# Patient Record
Sex: Male | Born: 1985 | Race: White | Hispanic: No | Marital: Single | Smoking: Never smoker
Health system: Southern US, Community
[De-identification: ages and names within clinical notes are randomized; demographics above are authoritative.]

---

## 2014-05-30 ENCOUNTER — Observation Stay (HOSPITAL_COMMUNITY)
Admission: EM | Admit: 2014-05-30 | Discharge: 2014-05-31 | Disposition: A | Discharge status: Home / Self Care | Payer: BC Managed Care – PPO | Attending: Otolaryngology | Admitting: Otolaryngology

## 2014-05-30 ENCOUNTER — Observation Stay (HOSPITAL_COMMUNITY): Payer: BC Managed Care – PPO | Admitting: Anesthesiology

## 2014-05-30 ENCOUNTER — Encounter (HOSPITAL_COMMUNITY)
Admission: EM | Disposition: A | Discharge status: Home / Self Care | Payer: Self-pay | Source: Home / Self Care | Attending: Emergency Medicine

## 2014-05-30 ENCOUNTER — Encounter (HOSPITAL_COMMUNITY): Payer: Self-pay | Admitting: Emergency Medicine

## 2014-05-30 ENCOUNTER — Encounter (HOSPITAL_COMMUNITY): Payer: BC Managed Care – PPO | Admitting: Anesthesiology

## 2014-05-30 ENCOUNTER — Emergency Department (HOSPITAL_COMMUNITY): Payer: BC Managed Care – PPO

## 2014-05-30 DIAGNOSIS — Y998 Other external cause status: Secondary | ICD-10-CM | POA: Diagnosis not present

## 2014-05-30 DIAGNOSIS — Y9389 Activity, other specified: Secondary | ICD-10-CM | POA: Insufficient documentation

## 2014-05-30 DIAGNOSIS — Y9289 Other specified places as the place of occurrence of the external cause: Secondary | ICD-10-CM | POA: Insufficient documentation

## 2014-05-30 DIAGNOSIS — S02609A Fracture of mandible, unspecified, initial encounter for closed fracture: Secondary | ICD-10-CM | POA: Diagnosis present

## 2014-05-30 DIAGNOSIS — S0262XA Fracture of subcondylar process of mandible, initial encounter for closed fracture: Principal | ICD-10-CM | POA: Insufficient documentation

## 2014-05-30 DIAGNOSIS — K088 Other specified disorders of teeth and supporting structures: Secondary | ICD-10-CM | POA: Insufficient documentation

## 2014-05-30 HISTORY — PX: ORIF MANDIBULAR FRACTURE: SHX2127

## 2014-05-30 SURGERY — OPEN REDUCTION INTERNAL FIXATION (ORIF) MANDIBULAR FRACTURE
Anesthesia: General | Site: Mouth

## 2014-05-30 MED ORDER — FENTANYL CITRATE 0.05 MG/ML IJ SOLN
INTRAMUSCULAR | Status: DC | PRN
  Administered 2014-05-30: 100 ug via INTRAVENOUS
  Administered 2014-05-30: 50 ug via INTRAVENOUS
  Administered 2014-05-30: 100 ug via INTRAVENOUS

## 2014-05-30 MED ORDER — ONDANSETRON HCL 4 MG/2ML IJ SOLN
INTRAMUSCULAR | Status: DC | PRN
  Administered 2014-05-30: 4 mg via INTRAVENOUS

## 2014-05-30 MED ORDER — PROMETHAZINE HCL 25 MG RE SUPP: 25.0000 mg | Freq: Four times a day (QID) | RECTAL | Status: DC | PRN

## 2014-05-30 MED ORDER — SUCCINYLCHOLINE CHLORIDE 20 MG/ML IJ SOLN
INTRAMUSCULAR | Status: DC | PRN
  Administered 2014-05-30: 120 mg via INTRAVENOUS

## 2014-05-30 MED ORDER — HYDROCODONE-ACETAMINOPHEN 7.5-325 MG/15ML PO SOLN: 15.0000 mL | Freq: Four times a day (QID) | ORAL | Status: DC | PRN

## 2014-05-30 MED ORDER — HYDROCODONE-ACETAMINOPHEN 7.5-325 MG/15ML PO SOLN
10.0000 mL | ORAL | Status: DC | PRN
  Administered 2014-05-30 – 2014-05-31 (×5): 15 mL via ORAL
  Filled 2014-05-30 (×5): qty 15

## 2014-05-30 MED ORDER — ROCURONIUM BROMIDE 50 MG/5ML IV SOLN
INTRAVENOUS | Status: AC
  Filled 2014-05-30: qty 1

## 2014-05-30 MED ORDER — CLINDAMYCIN PHOSPHATE 600 MG/50ML IV SOLN
600.0000 mg | Freq: Three times a day (TID) | INTRAVENOUS | Status: DC
  Administered 2014-05-30: 900 mg via INTRAVENOUS
  Administered 2014-05-30 – 2014-05-31 (×2): 600 mg via INTRAVENOUS
  Filled 2014-05-30 (×5): qty 50

## 2014-05-30 MED ORDER — SUCCINYLCHOLINE CHLORIDE 20 MG/ML IJ SOLN
INTRAMUSCULAR | Status: AC
  Filled 2014-05-30: qty 1

## 2014-05-30 MED ORDER — LIDOCAINE HCL (CARDIAC) 20 MG/ML IV SOLN
INTRAVENOUS | Status: AC
  Filled 2014-05-30: qty 5

## 2014-05-30 MED ORDER — FENTANYL CITRATE 0.05 MG/ML IJ SOLN
INTRAMUSCULAR | Status: AC
  Filled 2014-05-30: qty 5

## 2014-05-30 MED ORDER — OXYMETAZOLINE HCL 0.05 % NA SOLN
NASAL | Status: DC | PRN
  Administered 2014-05-30: 1 via NASAL

## 2014-05-30 MED ORDER — HYDROCODONE-ACETAMINOPHEN 5-325 MG PO TABS
2.0000 | ORAL_TABLET | Freq: Once | ORAL | Status: AC
  Administered 2014-05-30: 2 via ORAL
  Filled 2014-05-30: qty 2

## 2014-05-30 MED ORDER — PROPOFOL 10 MG/ML IV BOLUS
INTRAVENOUS | Status: AC
  Filled 2014-05-30: qty 20

## 2014-05-30 MED ORDER — CLINDAMYCIN PALMITATE HCL 75 MG/5ML PO SOLR
300.0000 mg | Freq: Three times a day (TID) | ORAL | Status: DC
  Administered 2014-05-30 – 2014-05-31 (×2): 300 mg via ORAL
  Filled 2014-05-30 (×6): qty 20

## 2014-05-30 MED ORDER — PROPOFOL 10 MG/ML IV BOLUS
INTRAVENOUS | Status: DC | PRN
  Administered 2014-05-30: 200 mg via INTRAVENOUS

## 2014-05-30 MED ORDER — OXYCODONE HCL 5 MG/5ML PO SOLN: 5.0000 mg | Freq: Once | ORAL | Status: DC | PRN

## 2014-05-30 MED ORDER — 0.9 % SODIUM CHLORIDE (POUR BTL) OPTIME
TOPICAL | Status: DC | PRN
  Administered 2014-05-30: 1000 mL

## 2014-05-30 MED ORDER — DEXTROSE-NACL 5-0.9 % IV SOLN
INTRAVENOUS | Status: DC
  Administered 2014-05-30: 18:00:00 via INTRAVENOUS

## 2014-05-30 MED ORDER — PROMETHAZINE HCL 25 MG PO TABS: 25.0000 mg | ORAL_TABLET | Freq: Four times a day (QID) | ORAL | Status: DC | PRN

## 2014-05-30 MED ORDER — SODIUM CHLORIDE 0.9 % IV SOLN
INTRAVENOUS | Status: AC
  Administered 2014-05-30: 11:00:00 via INTRAVENOUS

## 2014-05-30 MED ORDER — OXYMETAZOLINE HCL 0.05 % NA SOLN
NASAL | Status: AC
  Filled 2014-05-30: qty 15

## 2014-05-30 MED ORDER — HYDROMORPHONE HCL 1 MG/ML IJ SOLN: 0.2500 mg | INTRAMUSCULAR | Status: DC | PRN

## 2014-05-30 MED ORDER — BACITRACIN ZINC 500 UNIT/GM EX OINT
TOPICAL_OINTMENT | CUTANEOUS | Status: AC
  Filled 2014-05-30: qty 15

## 2014-05-30 MED ORDER — MIDAZOLAM HCL 2 MG/2ML IJ SOLN
INTRAMUSCULAR | Status: AC
  Filled 2014-05-30: qty 2

## 2014-05-30 MED ORDER — IBUPROFEN 100 MG/5ML PO SUSP
400.0000 mg | Freq: Four times a day (QID) | ORAL | Status: DC | PRN
  Filled 2014-05-30: qty 20

## 2014-05-30 MED ORDER — LIDOCAINE-EPINEPHRINE 1 %-1:100000 IJ SOLN
INTRAMUSCULAR | Status: AC
  Filled 2014-05-30: qty 1

## 2014-05-30 MED ORDER — MIDAZOLAM HCL 5 MG/5ML IJ SOLN
INTRAMUSCULAR | Status: DC | PRN
  Administered 2014-05-30: 2 mg via INTRAVENOUS

## 2014-05-30 MED ORDER — OXYCODONE HCL 5 MG PO TABS: 5.0000 mg | ORAL_TABLET | Freq: Once | ORAL | Status: DC | PRN

## 2014-05-30 MED ORDER — LACTATED RINGERS IV SOLN
INTRAVENOUS | Status: DC
  Administered 2014-05-30 (×2): via INTRAVENOUS

## 2014-05-30 MED ORDER — PROMETHAZINE HCL 25 MG/ML IJ SOLN: 6.2500 mg | INTRAMUSCULAR | Status: DC | PRN

## 2014-05-30 MED ORDER — ONDANSETRON HCL 4 MG/2ML IJ SOLN
INTRAMUSCULAR | Status: AC
  Filled 2014-05-30: qty 2

## 2014-05-30 MED ORDER — CLINDAMYCIN HCL 300 MG PO CAPS: 300.0000 mg | ORAL_CAPSULE | Freq: Three times a day (TID) | ORAL | Status: DC

## 2014-05-30 MED ORDER — LIDOCAINE HCL (CARDIAC) 20 MG/ML IV SOLN
INTRAVENOUS | Status: DC | PRN
  Administered 2014-05-30: 80 mg via INTRAVENOUS

## 2014-05-30 SURGICAL SUPPLY — 29 items
CANISTER SUCTION 2500CC (MISCELLANEOUS) ×2 IMPLANT
CLEANER TIP ELECTROSURG 2X2 (MISCELLANEOUS) ×2 IMPLANT
COVER SURGICAL LIGHT HANDLE (MISCELLANEOUS) ×2 IMPLANT
ELECT COATED BLADE 2.86 ST (ELECTRODE) IMPLANT
ELECT NEEDLE TIP 2.8 STRL (NEEDLE) ×2 IMPLANT
ELECT REM PT RETURN 9FT ADLT (ELECTROSURGICAL) ×2
ELECTRODE REM PT RTRN 9FT ADLT (ELECTROSURGICAL) ×1 IMPLANT
GLOVE ECLIPSE 7.5 STRL STRAW (GLOVE) ×2 IMPLANT
GOWN STRL REUS W/ TWL LRG LVL3 (GOWN DISPOSABLE) ×2 IMPLANT
GOWN STRL REUS W/TWL LRG LVL3 (GOWN DISPOSABLE) ×2
KIT BASIN OR (CUSTOM PROCEDURE TRAY) ×2 IMPLANT
KIT ROOM TURNOVER OR (KITS) ×2 IMPLANT
NEEDLE 27GAX1X1/2 (NEEDLE) ×2 IMPLANT
NS IRRIG 1000ML POUR BTL (IV SOLUTION) ×2 IMPLANT
PAD ARMBOARD 7.5X6 YLW CONV (MISCELLANEOUS) ×4 IMPLANT
PENCIL FOOT CONTROL (ELECTRODE) ×2 IMPLANT
PLATE HYBRID MMF SM (Plate) ×4 IMPLANT
SCISSORS WIRE DISP (INSTRUMENTS) ×2 IMPLANT
SCREW LOCK SELFDRIL 2.0X8M MMF (Screw) ×22 IMPLANT
SUT CHROMIC 3 0 PS 2 (SUTURE) ×2 IMPLANT
SUT STEEL 0 (SUTURE)
SUT STEEL 0 18XMFL TIE 17 (SUTURE) IMPLANT
SUT STEEL 2 (SUTURE) ×2 IMPLANT
SUT STEEL 4 (SUTURE) IMPLANT
SUT VICRYL 4-0 PS2 18IN ABS (SUTURE) ×2 IMPLANT
TOWEL OR 17X24 6PK STRL BLUE (TOWEL DISPOSABLE) ×2 IMPLANT
TOWEL OR 17X26 10 PK STRL BLUE (TOWEL DISPOSABLE) ×2 IMPLANT
TRAY ENT MC OR (CUSTOM PROCEDURE TRAY) ×2 IMPLANT
WATER STERILE IRR 1000ML POUR (IV SOLUTION) ×2 IMPLANT

## 2014-05-30 NOTE — ED Notes (Signed)
Patient returned from CT

## 2014-05-30 NOTE — ED Notes (Signed)
Pt resting; visitor at bedside; no needs at this time

## 2014-05-30 NOTE — Transfer of Care (Signed)
Immediate Anesthesia Transfer of Care Note  Patient: Craig Stewart  Procedure(s) Performed: Procedure(s): OPEN REDUCTION INTERNAL FIXATION (ORIF) MANDIBULAR FRACTURE (N/A)  Patient Location: PACU  Anesthesia Type:General  Level of Consciousness: awake and responds to stimulation  Airway & Oxygen Therapy: Patient Spontanous Breathing and Patient connected to nasal cannula oxygen  Post-op Assessment: Report given to PACU RN and Post -op Vital signs reviewed and stable  Post vital signs: Reviewed and stable  Complications: No apparent anesthesia complications

## 2014-05-30 NOTE — Anesthesia Postprocedure Evaluation (Signed)
  Anesthesia Post-op Note  Patient: Craig Stewart, Craig Stewart  Procedure(s) Performed: Procedure(s): OPEN REDUCTION INTERNAL FIXATION (ORIF) MANDIBULAR FRACTURE (N/A)  Patient Location: PACU  Anesthesia Type:General  Level of Consciousness: awake and alert   Airway and Oxygen Therapy: Patient Spontanous Breathing  Post-op Pain: mild  Post-op Assessment: Post-op Vital signs reviewed  Post-op Vital Signs: stable  Last Vitals:  Filed Vitals:   05/30/14 1440  BP:   Pulse: 88  Temp: 36.7 C  Resp:     Complications: No apparent anesthesia complications

## 2014-05-30 NOTE — ED Notes (Addendum)
Consent form filled out, Patient still waiting to talk with Dr. Pollyann Kennedyosen who will speak with him in pre-op.  Consent form to be sent up with patient.
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
## 2014-05-30 NOTE — Op Note (Signed)
OPERATIVE REPORT  DATE OF SURGERY: 05/30/2014  PATIENT:  Newt LukesPiotr Wrench,  28 y.o. male  PRE-OPERATIVE DIAGNOSIS:  MANDIBLE FX  POST-OPERATIVE DIAGNOSIS:  MANDIBLE FX  PROCEDURE:  Procedure(s): Maxillomandibular fixation treatment of MANDIBULAR FRACTURE  SURGEON:  Susy FrizzleJefry H Akaisha Truman, MD  ASSISTANTS: None   ANESTHESIA:   General   EBL:  20 ml  DRAINS: None   LOCAL MEDICATIONS USED:  None  SPECIMEN:  none  COUNTS:  Correct  PROCEDURE DETAILS: The patient was taken to the operating room and placed on the operating table in the supine position. Following induction of general endotracheal anesthesia using a nasal endotracheal tube, the patient was prepped and draped in a standard fashion. The teeth were brush with Betadine solution. A cheek retractor was used throughout the case. The anterior mandibular fracture was easily identified by mobilizing the 2 sides. The patient was brought back into normal occlusion. Hypercurved were placed upper and lower using 8 mm screws to secure them in place, taking care to avoid tooth root. 24-gauge wire loops were then placed 2 on each side to keep the occlusion in what appeared to be normal position. There was good stability. The oral cavity was rinsed with saline and suctioned. The patient was awakened, extubated and transferred to PACU in stable condition.    PATIENT DISPOSITION:  To PACU, stable

## 2014-05-30 NOTE — ED Notes (Signed)
Patient transported to CT 

## 2014-05-30 NOTE — Anesthesia Preprocedure Evaluation (Signed)
Anesthesia Evaluation  Patient identified by MRN, date of birth, ID band  History of Anesthesia Complications Negative for: history of anesthetic complications  Airway Mallampati: IV    Mouth opening: Limited Mouth Opening  Dental   Pulmonary neg pulmonary ROS,  breath sounds clear to auscultation        Cardiovascular negative cardio ROS  Rhythm:Regular Rate:Normal     Neuro/Psych    GI/Hepatic   Endo/Other    Renal/GU      Musculoskeletal   Abdominal   Peds negative pediatric ROS (+)  Hematology   Anesthesia Other Findings   Reproductive/Obstetrics                           Anesthesia Physical Anesthesia Plan  ASA: II  Anesthesia Plan: General   Post-op Pain Management:    Induction: Intravenous  Airway Management Planned: Nasal ETT  Additional Equipment:   Intra-op Plan:   Post-operative Plan:   Informed Consent: I have reviewed the patients History and Physical, chart, labs and discussed the procedure including the risks, benefits and alternatives for the proposed anesthesia with the patient or authorized representative who has indicated his/her understanding and acceptance.   Dental advisory given  Plan Discussed with: CRNA and Surgeon  Anesthesia Plan Comments:         Anesthesia Quick Evaluation
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
## 2014-05-30 NOTE — Discharge Instructions (Signed)
There are 4 circles of wire holding the jaws together, in the event of an emergency such as vomiting, cut call four and remove.   Fractured-Jaw Meal Plan The purpose of the fractured-jaw meal plan is to provide foods that can be easily blended and easily swallowed. This plan is typically used after jaw or mouth surgery, wired jaw surgery, or dental surgery. Foods in this plan need to be blended so that they can be sipped from a straw or given through a syringe. You should try to have at least three meals and three snacks daily. It is important to make sure you get enough calories and protein to prevent weight loss and help your body heal, especially after surgery. You may wish to include a liquid multivitamin in your plan to ensure that you get all the vitamins and minerals you need. Ask your health care provider for a recommendation.  HOW DO I PREPARE MY MEALS? All foods in this plan must be blended. Avoid nuts, seeds, skins, peels, bones, or any foods that cannot be blended to the right consistency. Make sure to eat a variety of foods from each food group every day. The following tips can help you as you blend your food:  Remove skins, seeds, and peels from food.  Cook meats and vegetables thoroughly.  Cut foods into small pieces and mix with a small amount of liquid in a food processor or blender. Continue to add liquid until the food becomes thin enough to sip through a straw.  Adding liquids such as juice, milk, cream, broth, gravy, or vegetable juice can help add flavor to foods.  Heat foods after they have been blended to reduce the amount of foam created from blending.  Heat or cool your foods to lukewarm temperatures if your teeth and mouth are sensitive to extreme temperatures. WHAT FOODS CAN I EAT? Make sure to eat a variety of foods from each food group.  Grains  Hot cereals, such as oatmeal, grits, ground wheat cereals, and polenta.  Rice and  pasta.  Couscous. Vegetables  All cooked or canned vegetables, without seeds and skins.  Vegetable juices.  Cooked potatoes, without skins. Fruit  Any cooked or canned fruits, without seeds and skins.  Fresh, peeled soft fruits, such as bananas and peaches, that can be blended until smooth.  All fruit juices, without seeds and skins. Meat and Other Protein Sources  Soft-boiled eggs, scrambled eggs, powdered eggs, pasteurized egg mixtures, and custard.  Ground meats, such as hamburger, [place]key, sausage, and meatloaf.  Tender, well-cooked meat, poultry, and fish prepared without bones or skin.  Soft soy foods (such as tofu).  Smooth nut butters. Dairy  All are allowed. Beverages  Coffee (regular or decaffeinated), tea, and mineral water. Condiments  All seasonings and condiments that blend well. WHEN MAY I NEED TO SUPPLEMENT MY MEALS? If you begin to lose weight on this plan, you may need to increase the amount of food you are eating or the number of calories in your food or both. You can increase the number of calories by adding any of the following foods:  Protein powder or powdered milk.  Extra fats, such as margarine (without trans fat), sour cream, cream cheese, cream, and nut butters, such as peanut butter or almond butter.  Sweets, such as honey, ice cream, blackstrap molasses, or sugar. Document Released: 01/25/2010 Document Revised: 12/22/2013 Document Reviewed: 07/04/2013 [facility] Patient Information 2015 [place], [place]. This information is not intended to replace advice given to  you by your health care provider. Make sure you discuss any questions you have with your health care provider.  Mandibular Fracture  A mandibular fracture is a break in the jawbone. Surgery is often needed to put the jaw back in the right position. Wires may be placed around the teeth to hold the jaw in place while it heals. HOME CARE  Put ice on the injured area.  Put ice in a  plastic bag.  Place a towel between your skin and the bag.  Leave the ice on for 15-20 minutes, 03-04 times a day. Do this for the first 2 days.  Only take medicines as told by your doctor.  Eat soft or liquid foods as told by your doctor. Eat plenty of protein.  If your jaws are wired, follow your doctor's directions for wired jaw care.  Sleep on your back to avoid putting pressure on your jaw.  Avoid exercising so hard that you become short of breath. GET HELP RIGHT AWAY IF:  You have a fever.  You have trouble breathing.  You feel like your airway is tight.  You cannot swallow your spit (saliva).  You make a high-pitched whistling sound when you breathe (wheezing).  You have a bad headache or lose feeling in your face (numbness).  You have bad jaw pain that does not get better with medicine.  Your jaw wires become loose.  You feel sick to your stomach (nauseous) or worried (anxious).  Your puffiness (swelling) or redness gets worse. MAKE SURE YOU:  Understand these instructions.  Will watch your condition.  Will get help right away if you are not doing well or get worse. Document Released: 10/30/2011 Document Reviewed: 10/30/2011 [facility] Patient Information 2015 [place], [place]. This information is not intended to replace advice given to you by your health care provider. Make sure you discuss any questions you have with your health care provider.
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
## 2014-05-31 NOTE — Progress Notes (Signed)
UR completed 

## 2014-05-31 NOTE — Progress Notes (Signed)
IV removed per order. Discharge instructions and prescription given with teachback. Wire cutters sent home with patient. Discharged via wheelchair to friend's care with NT present. Trina Aoarla Rakin Lemelle, RN

## 2014-05-31 NOTE — ED Provider Notes (Signed)
Medical screening examination/treatment/procedure(s) were conducted as a shared visit with non-physician practitioner(s) and myself.  I personally evaluated the patient during the encounter.   EKG Interpretation None     Patient with jaw fracture. Seen by ENT and taken to operating room.  Juliet RudeNathan R. Rubin PayorPickering, MD 05/31/14 [id number]

## 2014-05-31 NOTE — Discharge Summary (Signed)
  Physician Discharge Summary  Patient ID: Newt Lukesiotr Uttech MRN: [id number] DOB/AGE: Nov 09, 1985 28 y.o.  Admit date: 05/30/2014 Discharge date: 05/31/2014  Admission Diagnoses: Mandible fracture  Discharge Diagnoses:  Active Problems:   Mandible fracture   Discharged Condition: good  Hospital Course: No complication  Consults: none  Significant Diagnostic Studies: none  Treatments: surgery: Maxillomandibular fixation for mandible fracture  Discharge Exam: Blood pressure 131/79, pulse 65, temperature 97.6 F (36.4 C), temperature source Axillary, resp. rate 16, height 6' (1.829 m), weight 96.616 kg (213 lb), SpO2 98.00%. PHYSICAL EXAM: MMF in place and stable.  Disposition: Final discharge disposition not confirmed  Discharge Instructions   Diet full liquid    Complete by:  As directed      Diet full liquid    Complete by:  As directed      Increase activity slowly    Complete by:  As directed             Medication List    TAKE these medications       clindamycin 300 MG capsule  Commonly known as:  CLEOCIN  Take 1 capsule (300 mg total) by mouth 3 (three) times daily.     HYDROcodone-acetaminophen 7.5-325 mg/15 ml solution  Commonly known as:  HYCET  Take 15 mLs by mouth 4 (four) times daily as needed for moderate pain.     promethazine 25 MG suppository  Commonly known as:  PHENERGAN  Place 1 suppository (25 mg total) rectally every 6 (six) hours as needed for nausea or vomiting.      ASK your doctor about these medications       multivitamin with minerals Tabs tablet  Take 1 tablet by mouth daily.           Follow-up Information   Follow up with Serena ColonelOSEN, Roselynn Whitacre, MD. Schedule an appointment as soon as possible for a visit in 1 week.   Specialty:  Otolaryngology   Contact information:   [address] [place] [address] [phone]       Signed: Serena ColonelROSEN, Timithy Arons 05/31/2014, 8:23 AM

## 2014-06-01 ENCOUNTER — Encounter (HOSPITAL_COMMUNITY): Payer: Self-pay | Admitting: Otolaryngology

## 2014-06-03 ENCOUNTER — Encounter (HOSPITAL_COMMUNITY): Payer: Self-pay | Admitting: Otolaryngology

## 2014-07-07 ENCOUNTER — Encounter (HOSPITAL_BASED_OUTPATIENT_CLINIC_OR_DEPARTMENT_OTHER): Payer: Self-pay | Admitting: *Deleted

## 2014-07-09 ENCOUNTER — Ambulatory Visit: Payer: Self-pay | Admitting: Otolaryngology

## 2014-07-09 NOTE — H&P (Signed)
  Assessment  Fracture of multiple sites of mandible, with routine healing, subsequent encounter (V54.19) (S02.609D). Reason For Visit  Po mandibular fracture. Discussed  Doing very well. The pain and swelling have mostly resolved. MMF intact. Minimal mobility. Occlusion looks normal. Continue MMF and followup in 2 weeks. Allergies  No Known Drug Allergies. Current Meds  Promethegan 25 MG Rectal Suppository;; RPT Clindamycin Palmitate HCl - 75 MG/5ML Oral Solution Reconstituted;; RPT Hydrocodone-Acetaminophen 7.5-325 MG/15ML Oral Solution;; RPT. PSH  Closed Treatment Of Mandibular Fracture Oct 2015. Vital Signs   Recorded by [facility] Hospitalkolimowski,Sharon on 08 Jun 2014 09:47 AM BP:120/70,  Height: 6 ft , Weight: 213 lb , BMI: 28.9 kg/m2,  BMI Calculated: 28.89 ,  BSA Calculated: 2.19. Signature  Electronically signed by : Serena ColonelJefry  Tandy Lewin  M.D.; 06/08/2014 10:10 AM EST.

## 2014-07-13 ENCOUNTER — Encounter (HOSPITAL_BASED_OUTPATIENT_CLINIC_OR_DEPARTMENT_OTHER)
Admission: RE | Disposition: A | Discharge status: Home / Self Care | Payer: Self-pay | Source: Ambulatory Visit | Attending: Otolaryngology

## 2014-07-13 ENCOUNTER — Encounter (HOSPITAL_BASED_OUTPATIENT_CLINIC_OR_DEPARTMENT_OTHER): Payer: Self-pay | Admitting: Certified Registered"

## 2014-07-13 ENCOUNTER — Ambulatory Visit (HOSPITAL_BASED_OUTPATIENT_CLINIC_OR_DEPARTMENT_OTHER): Payer: BC Managed Care – PPO | Admitting: Certified Registered"

## 2014-07-13 ENCOUNTER — Ambulatory Visit (HOSPITAL_BASED_OUTPATIENT_CLINIC_OR_DEPARTMENT_OTHER)
Admission: RE | Admit: 2014-07-13 | Discharge: 2014-07-13 | Disposition: A | Discharge status: Home / Self Care | Payer: BC Managed Care – PPO | Source: Ambulatory Visit | Attending: Otolaryngology | Admitting: Otolaryngology

## 2014-07-13 DIAGNOSIS — Z472 Encounter for removal of internal fixation device: Secondary | ICD-10-CM | POA: Diagnosis not present

## 2014-07-13 HISTORY — PX: MANDIBULAR HARDWARE REMOVAL: SHX5205

## 2014-07-13 LAB — POCT HEMOGLOBIN-HEMACUE: Hemoglobin: 16.3 g/dL (ref 13.0–17.0)

## 2014-07-13 SURGERY — REMOVAL, HARDWARE, MANDIBLE
Anesthesia: General | Site: Mouth

## 2014-07-13 MED ORDER — FENTANYL CITRATE 0.05 MG/ML IJ SOLN
25.0000 ug | INTRAMUSCULAR | Status: DC | PRN
  Administered 2014-07-13: 50 ug via INTRAVENOUS

## 2014-07-13 MED ORDER — ONDANSETRON HCL 4 MG/2ML IJ SOLN
INTRAMUSCULAR | Status: DC | PRN
  Administered 2014-07-13 (×2): 4 mg via INTRAVENOUS

## 2014-07-13 MED ORDER — PROMETHAZINE HCL 25 MG RE SUPP: 25.0000 mg | Freq: Four times a day (QID) | RECTAL | Status: DC | PRN

## 2014-07-13 MED ORDER — BACITRACIN ZINC 500 UNIT/GM EX OINT
TOPICAL_OINTMENT | CUTANEOUS | Status: AC
  Filled 2014-07-13: qty 0.9

## 2014-07-13 MED ORDER — PROPOFOL 10 MG/ML IV BOLUS
INTRAVENOUS | Status: DC | PRN
  Administered 2014-07-13: 200 mg via INTRAVENOUS

## 2014-07-13 MED ORDER — FENTANYL CITRATE 0.05 MG/ML IJ SOLN
INTRAMUSCULAR | Status: AC
  Filled 2014-07-13: qty 2

## 2014-07-13 MED ORDER — FENTANYL CITRATE 0.05 MG/ML IJ SOLN
INTRAMUSCULAR | Status: DC | PRN
  Administered 2014-07-13: 50 ug via INTRAVENOUS

## 2014-07-13 MED ORDER — CEFAZOLIN SODIUM-DEXTROSE 2-3 GM-% IV SOLR: 2.0000 g | INTRAVENOUS | Status: DC

## 2014-07-13 MED ORDER — LACTATED RINGERS IV SOLN
INTRAVENOUS | Status: DC | PRN
  Administered 2014-07-13 (×2): via INTRAVENOUS

## 2014-07-13 MED ORDER — OXYCODONE HCL 5 MG PO TABS
5.0000 mg | ORAL_TABLET | Freq: Once | ORAL | Status: AC | PRN
Start: 2014-07-13 — End: 2014-07-13

## 2014-07-13 MED ORDER — OXYCODONE HCL 5 MG/5ML PO SOLN
ORAL | Status: AC
  Filled 2014-07-13: qty 5

## 2014-07-13 MED ORDER — LIDOCAINE HCL (CARDIAC) 20 MG/ML IV SOLN
INTRAVENOUS | Status: DC | PRN
  Administered 2014-07-13: 60 mg via INTRAVENOUS

## 2014-07-13 MED ORDER — HYDROCODONE-ACETAMINOPHEN 7.5-325 MG PO TABS: 1.0000 | ORAL_TABLET | Freq: Four times a day (QID) | ORAL | Status: DC | PRN

## 2014-07-13 MED ORDER — LACTATED RINGERS IV SOLN
INTRAVENOUS | Status: DC
  Administered 2014-07-13: 08:00:00 via INTRAVENOUS

## 2014-07-13 MED ORDER — LIDOCAINE-EPINEPHRINE 1 %-1:100000 IJ SOLN
INTRAMUSCULAR | Status: AC
  Filled 2014-07-13: qty 1

## 2014-07-13 MED ORDER — OXYCODONE HCL 5 MG/5ML PO SOLN
5.0000 mg | Freq: Once | ORAL | Status: AC | PRN
  Administered 2014-07-13: 5 mg via ORAL

## 2014-07-13 MED ORDER — MIDAZOLAM HCL 2 MG/ML PO SYRP: 12.0000 mg | ORAL_SOLUTION | Freq: Once | ORAL | Status: DC | PRN

## 2014-07-13 MED ORDER — FENTANYL CITRATE 0.05 MG/ML IJ SOLN: 50.0000 ug | INTRAMUSCULAR | Status: DC | PRN

## 2014-07-13 MED ORDER — PROPOFOL 10 MG/ML IV BOLUS
INTRAVENOUS | Status: AC
  Filled 2014-07-13: qty 20

## 2014-07-13 MED ORDER — MIDAZOLAM HCL 2 MG/2ML IJ SOLN: 1.0000 mg | INTRAMUSCULAR | Status: DC | PRN

## 2014-07-13 MED ORDER — SUCCINYLCHOLINE CHLORIDE 20 MG/ML IJ SOLN
INTRAMUSCULAR | Status: AC
  Filled 2014-07-13: qty 1

## 2014-07-13 MED ORDER — GLYCOPYRROLATE 0.2 MG/ML IJ SOLN
INTRAMUSCULAR | Status: DC | PRN
  Administered 2014-07-13: 0.2 mg via INTRAVENOUS

## 2014-07-13 MED ORDER — MIDAZOLAM HCL 5 MG/5ML IJ SOLN
INTRAMUSCULAR | Status: DC | PRN
  Administered 2014-07-13: 2 mg via INTRAVENOUS

## 2014-07-13 MED ORDER — MIDAZOLAM HCL 2 MG/2ML IJ SOLN
INTRAMUSCULAR | Status: AC
  Filled 2014-07-13: qty 2

## 2014-07-13 MED ORDER — ONDANSETRON HCL 4 MG/2ML IJ SOLN: 4.0000 mg | Freq: Once | INTRAMUSCULAR | Status: DC | PRN

## 2014-07-13 SURGICAL SUPPLY — 29 items
BLADE SURG 15 STRL LF DISP TIS (BLADE) ×1 IMPLANT
BLADE SURG 15 STRL SS (BLADE) ×1
CANISTER SUCT 1200ML W/VALVE (MISCELLANEOUS) ×2 IMPLANT
COVER MAYO STAND STRL (DRAPES) ×2 IMPLANT
DECANTER SPIKE VIAL GLASS SM (MISCELLANEOUS) IMPLANT
ELECT COATED BLADE 2.86 ST (ELECTRODE) IMPLANT
ELECT REM PT RETURN 9FT ADLT (ELECTROSURGICAL)
ELECTRODE REM PT RTRN 9FT ADLT (ELECTROSURGICAL) IMPLANT
GAUZE SPONGE 4X4 16PLY XRAY LF (GAUZE/BANDAGES/DRESSINGS) IMPLANT
GLOVE BIO SURGEON STRL SZ 6.5 (GLOVE) ×2 IMPLANT
GLOVE BIOGEL PI IND STRL 7.0 (GLOVE) ×1 IMPLANT
GLOVE BIOGEL PI INDICATOR 7.0 (GLOVE) ×1
GLOVE ECLIPSE 7.5 STRL STRAW (GLOVE) ×2 IMPLANT
GOWN STRL REUS W/ TWL LRG LVL3 (GOWN DISPOSABLE) ×2 IMPLANT
GOWN STRL REUS W/TWL LRG LVL3 (GOWN DISPOSABLE) ×2
MARKER SKIN DUAL TIP RULER LAB (MISCELLANEOUS) IMPLANT
NEEDLE 27GAX1X1/2 (NEEDLE) IMPLANT
NS IRRIG 1000ML POUR BTL (IV SOLUTION) IMPLANT
PACK BASIN DAY SURGERY FS (CUSTOM PROCEDURE TRAY) ×2 IMPLANT
PENCIL FOOT CONTROL (ELECTRODE) IMPLANT
SCISSORS WIRE ANG 4 3/4 DISP (INSTRUMENTS) IMPLANT
SHEET MEDIUM DRAPE 40X70 STRL (DRAPES) ×2 IMPLANT
SUT CHROMIC 3 0 PS 2 (SUTURE) IMPLANT
SUT CHROMIC 4 0 PS 2 18 (SUTURE) IMPLANT
SYR CONTROL 10ML LL (SYRINGE) IMPLANT
TOWEL OR 17X24 6PK STRL BLUE (TOWEL DISPOSABLE) ×4 IMPLANT
TRAY DSU PREP LF (CUSTOM PROCEDURE TRAY) IMPLANT
TUBE CONNECTING 20X1/4 (TUBING) ×2 IMPLANT
YANKAUER SUCT BULB TIP NO VENT (SUCTIONS) ×2 IMPLANT

## 2014-07-13 NOTE — Anesthesia Postprocedure Evaluation (Signed)
  Anesthesia Post-op Note  Patient: Engineer, wateriotr Crego  Procedure(s) Performed: Procedure(s): MANDIBULAR HARDWARE REMOVAL (N/A)  Patient Location: PACU  Anesthesia Type: General   Level of Consciousness: awake, alert  and oriented  Airway and Oxygen Therapy: Patient Spontanous Breathing  Post-op Pain: mild  Post-op Assessment: Post-op Vital signs reviewed  Post-op Vital Signs: Reviewed  Last Vitals:  Filed Vitals:   07/13/14 0845  BP: 123/79  Pulse: 53  Temp:   Resp: 13    Complications: No apparent anesthesia complications

## 2014-07-13 NOTE — Anesthesia Preprocedure Evaluation (Addendum)
Anesthesia Evaluation  Patient identified by MRN, date of birth, ID band Patient awake    Reviewed: Allergy & Precautions, H&P , NPO status , Patient's Chart, lab work & pertinent test results  Airway   TM Distance: >3 FB   Mouth opening: Limited Mouth Opening  Dental  (+) Teeth Intact, Dental Advisory Given   Pulmonary  breath sounds clear to auscultation        Cardiovascular Rhythm:Regular Rate:Normal     Neuro/Psych    GI/Hepatic   Endo/Other    Renal/GU      Musculoskeletal   Abdominal   Peds  Hematology   Anesthesia Other Findings Mouth wired, unable to examine airway.  Reproductive/Obstetrics                            Anesthesia Physical Anesthesia Plan  ASA: I  Anesthesia Plan: General   Post-op Pain Management:    Induction: Intravenous  Airway Management Planned: Mask  Additional Equipment:   Intra-op Plan:   Post-operative Plan:   Informed Consent: I have reviewed the patients History and Physical, chart, labs and discussed the procedure including the risks, benefits and alternatives for the proposed anesthesia with the patient or authorized representative who has indicated his/her understanding and acceptance.   Dental advisory given  Plan Discussed with: CRNA, Anesthesiologist and Surgeon  Anesthesia Plan Comments: (Will not use Scop patch due to short duration of case.)       Anesthesia Quick Evaluation

## 2014-07-13 NOTE — Interval H&P Note (Signed)
History and Physical Interval Note:  07/13/2014 7:16 AM  Craig Stewart  has presented today for surgery, with the diagnosis of Post mandible fracture  The various methods of treatment have been discussed with the patient and family. After consideration of risks, benefits and other options for treatment, the patient has consented to  Procedure(s): MANDIBULAR HARDWARE REMOVAL (N/A) as a surgical intervention .  The patient's history has been reviewed, patient examined, no change in status, stable for surgery.  I have reviewed the patient's chart and labs.  Questions were answered to the patient's satisfaction.     Vernetta Dizdarevic

## 2014-07-13 NOTE — Op Note (Signed)
OPERATIVE REPORT  DATE OF SURGERY: 07/13/2014  PATIENT:  [name],  28 y.o. male  PRE-OPERATIVE DIAGNOSIS:  Post mandible fracture  POST-OPERATIVE DIAGNOSIS:  Post mandible fracture  PROCEDURE:  Procedure(s): MANDIBULAR HARDWARE REMOVAL  SURGEON:  Susy FrizzleJefry H Lonald Troiani, MD  ASSISTANTS: None  ANESTHESIA:   General   EBL:  30 ml  DRAINS: None  LOCAL MEDICATIONS USED:  None  SPECIMEN:  none  COUNTS:  Correct  PROCEDURE DETAILS: The patient was taken to the operating room and placed on the operating table in the supine position. Following induction of intravenous and inhalation anesthesia, the patient was draped in a standard fashion. The MMF was released by cutting the 4 wire loops and removing them. All of the upper and lower screws were identified and uncovered using a Therapist, nutritionalreer elevator and then removed. The arch bars were then removed. The occlusion looked excellent. Bleeding was controlled using gentle pressure. The pharynx and oral cavity were suctioned. Patient was awakened and transferred to recovery in stable condition.    PATIENT DISPOSITION:  To PACU, stable

## 2014-07-13 NOTE — Discharge Instructions (Signed)
Start with a soft diet. Slowly advanced to a regular diet over the next 2 weeks. Pressure teeth and follow-up with your dentist and orthodontist routinely.  Call your surgeon if you experience:   1.  Fever over 101.0. 2.  Inability to urinate. 3.  Nausea and/or vomiting. 4.  Extreme swelling or bruising at the surgical site. 5.  Continued bleeding from the incision. 6.  Increased pain, redness or drainage from the incision. 7.  Problems related to your pain medication. 8. Any change in color, movement and/or sensation 9. Any problems and/or concerns   Post Anesthesia Home Care Instructions  Activity: Get plenty of rest for the remainder of the day. A responsible adult should stay with you for 24 hours following the procedure.  For the next 24 hours, DO NOT: -Drive a car -Advertising copywriterperate machinery -Drink alcoholic beverages -Take any medication unless instructed by your physician -Make any legal decisions or sign important papers.  Meals: Start with liquid foods such as gelatin or soup. Progress to regular foods as tolerated. Avoid greasy, spicy, heavy foods. If nausea and/or vomiting occur, drink only clear liquids until the nausea and/or vomiting subsides. Call your physician if vomiting continues.  Special Instructions/Symptoms: Your throat may feel dry or sore from the anesthesia or the breathing tube placed in your throat during surgery. If this causes discomfort, gargle with warm salt water. The discomfort should disappear within 24 hours.

## 2014-07-13 NOTE — Transfer of Care (Signed)
Immediate Anesthesia Transfer of Care Note  Patient: Craig Stewart  Procedure(s) Performed: Procedure(s): MANDIBULAR HARDWARE REMOVAL (N/A)  Patient Location: PACU  Anesthesia Type:General  Level of Consciousness: awake and patient cooperative  Airway & Oxygen Therapy: Patient Spontanous Breathing and Patient connected to face mask oxygen  Post-op Assessment: Report given to PACU RN and Post -op Vital signs reviewed and stable  Post vital signs: Reviewed and stable  Complications: No apparent anesthesia complications

## 2014-07-13 NOTE — H&P (View-Only) (Signed)
  Assessment  Fracture of multiple sites of mandible, with routine healing, subsequent encounter (V54.19) (S02.609D). Reason For Visit  Po mandibular fracture. Discussed  Doing very well. The pain and swelling have mostly resolved. MMF intact. Minimal mobility. Occlusion looks normal. Continue MMF and followup in 2 weeks. Allergies  No Known Drug Allergies. Current Meds  Promethegan 25 MG Rectal Suppository;; RPT Clindamycin Palmitate HCl - 75 MG/5ML Oral Solution Reconstituted;; RPT Hydrocodone-Acetaminophen 7.5-325 MG/15ML Oral Solution;; RPT. PSH  Closed Treatment Of Mandibular Fracture Oct 2015. Vital Signs   Recorded by Skolimowski,Sharon on 08 Jun 2014 09:47 AM BP:120/70,  Height: 6 ft , Weight: 213 lb , BMI: 28.9 kg/m2,  BMI Calculated: 28.89 ,  BSA Calculated: 2.19. Signature  Electronically signed by : Jethro Radke  M.D.; 06/08/2014 10:10 AM EST.  

## 2014-07-13 NOTE — Anesthesia Procedure Notes (Signed)
Date/Time: 07/13/2014 7:30 AM Performed by: Yvonna Brun Pre-anesthesia Checklist: Patient identified, Emergency Drugs available, Suction available, Patient being monitored and Timeout performed Patient Re-evaluated:Patient Re-evaluated prior to inductionOxygen Delivery Method: Circle system utilized Intubation Type: IV induction Ventilation: Mask ventilation without difficulty

## 2014-07-15 ENCOUNTER — Encounter (HOSPITAL_BASED_OUTPATIENT_CLINIC_OR_DEPARTMENT_OTHER): Payer: Self-pay | Admitting: Otolaryngology

## 2014-12-22 ENCOUNTER — Other Ambulatory Visit: Payer: Self-pay | Admitting: Family Medicine

## 2014-12-22 ENCOUNTER — Ambulatory Visit
Admission: RE | Admit: 2014-12-22 | Discharge: 2014-12-22 | Disposition: A | Discharge status: Home / Self Care | Payer: BLUE CROSS/BLUE SHIELD | Source: Ambulatory Visit | Attending: Family Medicine | Admitting: Family Medicine

## 2014-12-22 DIAGNOSIS — S99921A Unspecified injury of right foot, initial encounter: Secondary | ICD-10-CM

## 2015-10-05 ENCOUNTER — Ambulatory Visit (INDEPENDENT_AMBULATORY_CARE_PROVIDER_SITE_OTHER): Payer: 59 | Admitting: Sports Medicine

## 2015-10-05 ENCOUNTER — Encounter: Payer: Self-pay | Admitting: Sports Medicine

## 2015-10-05 VITALS — BP 126/71 | HR 60 | Resp 18 | Wt 211.4 lb

## 2015-10-05 DIAGNOSIS — S83412A Sprain of medial collateral ligament of left knee, initial encounter: Secondary | ICD-10-CM

## 2015-10-05 DIAGNOSIS — S83419A Sprain of medial collateral ligament of unspecified knee, initial encounter: Secondary | ICD-10-CM | POA: Insufficient documentation

## 2015-10-05 NOTE — Progress Notes (Signed)
  Subjective:    CC: Left knee injury.   HPI:  This is a pleasant 30 year old male, a week ago he was playing soccer, he planted his left leg, twisted to the left and then fell applying a valgus stress to his knee, since then he's had pain that he localizes at the medial joint line, worse with weightbearing and application of valgus stress, no mechanical symptoms. Pain is moderate, persistent. No swelling, no bruising.  Past medical history, Surgical history, Family history not pertinant except as noted below, Social history, Allergies, and medications have been entered into the medical record, reviewed, and no changes needed.   Review of Systems: No headache, visual changes, nausea, vomiting, diarrhea, constipation, dizziness, abdominal pain, skin rash, fevers, chills, night sweats, swollen lymph nodes, weight loss, chest pain, body aches, joint swelling, muscle aches, shortness of breath, mood changes, visual or auditory hallucinations.  Objective:    General: Well Developed, well nourished, and in no acute distress.  Neuro: Alert and oriented x3, extra-ocular muscles intact, sensation grossly intact.  HEENT: Normocephalic, atraumatic, pupils equal round reactive to light, neck supple, no masses, no lymphadenopathy, thyroid nonpalpable.  Skin: Warm and dry, no rashes noted.  Cardiac: Regular rate and rhythm, no murmurs rubs or gallops.  Respiratory: Clear to auscultation bilaterally. Not using accessory muscles, speaking in full sentences.  Abdominal: Soft, nontender, nondistended, positive bowel sounds, no masses, no organomegaly.  Left Knee: Normal to inspection with no erythema or effusion or obvious bony abnormalities. Palpation normal with no warmth or joint line tenderness or patellar tenderness or condyle tenderness. ROM normal in flexion and extension and lower leg rotation. Ligaments with solid consistent endpoints including ACL, PCL, LCL, minimal laxity with the application of  valgus stress, with pain over the MCL. Application of valgus stress also reproduces his pain. Negative Mcmurray's and provocative meniscal tests. Non painful patellar compression. Patellar and quadriceps tendons unremarkable. Hamstring and quadriceps strength is normal.  Impression and Recommendations:    The patient was counselled, risk factors were discussed, anticipatory guidance given.

## 2015-10-05 NOTE — Assessment & Plan Note (Signed)
Continue NSAIDs as needed, hinged knee brace, there is also mild patellar tendinitis, rehabilitation exercises given, return as needed.

## 2015-11-10 ENCOUNTER — Telehealth: Payer: Self-pay | Admitting: Sports Medicine

## 2015-11-10 DIAGNOSIS — S83412A Sprain of medial collateral ligament of left knee, initial encounter: Secondary | ICD-10-CM

## 2015-11-10 NOTE — Telephone Encounter (Signed)
Patient calls requesting MRI, persistent pain in his left knee after injury.

## 2015-11-13 ENCOUNTER — Ambulatory Visit (HOSPITAL_BASED_OUTPATIENT_CLINIC_OR_DEPARTMENT_OTHER)
Admission: RE | Admit: 2015-11-13 | Discharge: 2015-11-13 | Disposition: A | Discharge status: Home / Self Care | Payer: 59 | Source: Ambulatory Visit | Attending: Sports Medicine | Admitting: Sports Medicine

## 2015-11-13 DIAGNOSIS — S83412A Sprain of medial collateral ligament of left knee, initial encounter: Secondary | ICD-10-CM | POA: Insufficient documentation

## 2015-11-13 DIAGNOSIS — M2242 Chondromalacia patellae, left knee: Secondary | ICD-10-CM | POA: Insufficient documentation

## 2015-11-13 DIAGNOSIS — Y9366 Activity, soccer: Secondary | ICD-10-CM | POA: Diagnosis not present

## 2015-11-13 DIAGNOSIS — R600 Localized edema: Secondary | ICD-10-CM | POA: Insufficient documentation

## 2015-11-13 DIAGNOSIS — X501XXA Overexertion from prolonged static or awkward postures, initial encounter: Secondary | ICD-10-CM | POA: Diagnosis not present

## 2016-02-29 ENCOUNTER — Encounter: Payer: Self-pay | Admitting: Sports Medicine

## 2016-02-29 ENCOUNTER — Ambulatory Visit (INDEPENDENT_AMBULATORY_CARE_PROVIDER_SITE_OTHER): Payer: 59 | Admitting: Sports Medicine

## 2016-02-29 VITALS — BP 113/74 | HR 50 | Resp 16 | Wt 210.4 lb

## 2016-02-29 DIAGNOSIS — S86011A Strain of right Achilles tendon, initial encounter: Secondary | ICD-10-CM | POA: Insufficient documentation

## 2016-02-29 DIAGNOSIS — R635 Abnormal weight gain: Secondary | ICD-10-CM | POA: Diagnosis not present

## 2016-02-29 MED ORDER — PHENTERMINE HCL 37.5 MG PO TABS: ORAL_TABLET | ORAL | Status: DC

## 2016-02-29 MED ORDER — NAPROXEN-ESOMEPRAZOLE 500-20 MG PO TBEC: 1.0000 | DELAYED_RELEASE_TABLET | Freq: Two times a day (BID) | ORAL | Status: DC

## 2016-02-29 NOTE — Assessment & Plan Note (Signed)
Occurred while crunk. The symptom represents an insertional Achilles strain.  Heel lift, NSAIDs, rehabilitation exercises.  Return as needed.

## 2016-02-29 NOTE — Assessment & Plan Note (Signed)
Starting phentermine, return monthly for weight checks and refills. 

## 2016-02-29 NOTE — Progress Notes (Addendum)
Patient ID: Craig Stewart, male   DOB: 13-Jul-1986, 30 y.o.   MRN: [id number]   Subjective:    CC: R ankle pain   HPI: 30 yo presenting with three days of R ankle pain after tripping on an object while drinking.  Pain is near Achilles insertion site and does not radiate.  Pain is worse when walking.  No swelling or erythema.  He says pain has been gradually improving.  Ibuprofen helps for pain relief.     Would also like to discuss weight loss medication  Past medical history, Surgical history, Family history not pertinant except as noted below, Social history, Allergies, and medications have been entered into the medical record, reviewed, and no changes needed.   Review of Systems: No fevers, chills, night sweats, weight loss, chest pain, or shortness of breath.   Objective:    General: Well Developed, well nourished, and in no acute distress.  Neuro: Alert and oriented x3, extra-ocular muscles intact, sensation grossly intact.  HEENT: Normocephalic, atraumatic, pupils equal round reactive to light, neck supple, no masses, no lymphadenopathy, thyroid nonpalpable.  Skin: Warm and dry, no rashes. Cardiac: Regular rate and rhythm, no murmurs rubs or gallops, no lower extremity edema.  Respiratory: Clear to auscultation bilaterally. Not using accessory muscles, speaking in full sentences. Right Ankle: No visible erythema or swelling. Range of motion is full in all directions. Pain at Achilles insertion site.   Impression and Recommendations:   R Achilles strain  1. Will give heel lift.  Naproxen PRN.  Return to clinic if pain does not resolve in 2-3 weeks.  2. Will prescribe phentermine.  Follow-up in 1 month.

## 2016-03-28 ENCOUNTER — Ambulatory Visit: Payer: BLUE CROSS/BLUE SHIELD | Admitting: Sports Medicine

## 2016-03-29 ENCOUNTER — Ambulatory Visit: Payer: BLUE CROSS/BLUE SHIELD | Admitting: Sports Medicine

## 2016-09-18 DIAGNOSIS — R972 Elevated prostate specific antigen [PSA]: Secondary | ICD-10-CM | POA: Diagnosis not present

## 2016-09-18 DIAGNOSIS — R17 Unspecified jaundice: Secondary | ICD-10-CM | POA: Diagnosis not present

## 2016-10-20 ENCOUNTER — Encounter: Payer: Self-pay | Admitting: Sports Medicine

## 2016-10-20 ENCOUNTER — Ambulatory Visit (INDEPENDENT_AMBULATORY_CARE_PROVIDER_SITE_OTHER): Payer: 59

## 2016-10-20 ENCOUNTER — Ambulatory Visit (INDEPENDENT_AMBULATORY_CARE_PROVIDER_SITE_OTHER): Payer: 59 | Admitting: Sports Medicine

## 2016-10-20 DIAGNOSIS — S63619A Unspecified sprain of unspecified finger, initial encounter: Secondary | ICD-10-CM

## 2016-10-20 DIAGNOSIS — M79644 Pain in right finger(s): Secondary | ICD-10-CM

## 2016-10-20 DIAGNOSIS — S6991XA Unspecified injury of right wrist, hand and finger(s), initial encounter: Secondary | ICD-10-CM | POA: Diagnosis not present

## 2016-10-20 DIAGNOSIS — S63601A Unspecified sprain of right thumb, initial encounter: Secondary | ICD-10-CM | POA: Insufficient documentation

## 2016-10-20 NOTE — Progress Notes (Signed)
  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 Pain is localized on the radial aspect, moderate, persistent without radiation. He is afraid of developing a boutonniere's deformity as he did on the left side after a similar injury.  Past medical history:  Negative.  See flowsheet/record as well for more information.  Surgical history: Negative.  See flowsheet/record as well for more information.  Family history: Negative.  See flowsheet/record as well for more information.  Social history: Negative.  See flowsheet/record as well for more information.  Allergies, and medications have been entered into the medical record, reviewed, and no changes needed.   Review of Systems: No fevers, chills, night sweats, weight loss, chest pain, or shortness of breath.   Objective:    General: Well Developed, well nourished, and in no acute distress.  Neuro: Alert and oriented x3, extra-ocular muscles intact, sensation grossly intact.  HEENT: Normocephalic, atraumatic, pupils equal round reactive to light, neck supple, no masses, no lymphadenopathy, thyroid nonpalpable.  Skin: Warm and dry, no rashes. Cardiac: Regular rate and rhythm, no murmurs rubs or gallops, no lower extremity edema.  Respiratory: Clear to auscultation bilaterally. Not using accessory muscles, speaking in full sentences. Right hand: Tender to palpation with swelling and bruising at the fourth PIP, full range of motion, full strength, reproduction of pain with stressing the radial collateral ligament at the PIP, ulnar collateral is unremarkable. 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  Third and fourth fingers on the right hand were buddy taped together.  Impression  and Recommendations:    Sprain of right fourth PIP radial collateral ligament Yesterday, x-rays, buddy taped, can use over-the-counter NSAIDs. On the left side he does have somewhat of a boutonniere's deformity and bowstring of the flexor tendon after an injury years ago.

## 2016-10-20 NOTE — Assessment & Plan Note (Signed)
Yesterday, x-rays, buddy taped, can use over-the-counter NSAIDs. On the left side he does have somewhat of a boutonniere's deformity and bowstring of the flexor tendon after an injury years ago.

## 2016-11-28 ENCOUNTER — Telehealth: Payer: Self-pay | Admitting: Sports Medicine

## 2016-11-28 DIAGNOSIS — S63601D Unspecified sprain of right thumb, subsequent encounter: Secondary | ICD-10-CM

## 2016-11-28 DIAGNOSIS — S63619D Unspecified sprain of unspecified finger, subsequent encounter: Principal | ICD-10-CM

## 2016-11-28 NOTE — Telephone Encounter (Signed)
Patient calling, persistent pain in the right ring finger, localized radial aspect at the PIP, symptoms have been present for greater than 6 weeks despite conservative measures, question early developing boutonniere's deformity. Ordering MRI, further management will depend on results.

## 2016-12-04 ENCOUNTER — Ambulatory Visit (INDEPENDENT_AMBULATORY_CARE_PROVIDER_SITE_OTHER): Payer: 59

## 2016-12-04 DIAGNOSIS — S63601D Unspecified sprain of right thumb, subsequent encounter: Secondary | ICD-10-CM

## 2016-12-04 DIAGNOSIS — M79644 Pain in right finger(s): Secondary | ICD-10-CM | POA: Diagnosis not present

## 2016-12-04 DIAGNOSIS — R600 Localized edema: Secondary | ICD-10-CM | POA: Diagnosis not present

## 2016-12-04 DIAGNOSIS — S63619D Unspecified sprain of unspecified finger, subsequent encounter: Principal | ICD-10-CM

## 2016-12-04 DIAGNOSIS — M7989 Other specified soft tissue disorders: Secondary | ICD-10-CM

## 2016-12-08 ENCOUNTER — Ambulatory Visit: Payer: 59 | Admitting: Sports Medicine

## 2016-12-11 ENCOUNTER — Encounter: Payer: Self-pay | Admitting: Sports Medicine

## 2016-12-11 ENCOUNTER — Ambulatory Visit: Payer: 59 | Admitting: Sports Medicine

## 2016-12-11 DIAGNOSIS — S63601D Unspecified sprain of right thumb, subsequent encounter: Secondary | ICD-10-CM | POA: Diagnosis not present

## 2016-12-11 DIAGNOSIS — S63619D Unspecified sprain of unspecified finger, subsequent encounter: Secondary | ICD-10-CM | POA: Diagnosis not present

## 2016-12-11 NOTE — Assessment & Plan Note (Signed)
Exos boxer's fracture cast placed. We will to keep on for 2-3 weeks.

## 2016-12-11 NOTE — Progress Notes (Signed)
  Subjective: Persistent swelling at the right fourth finger. MRI showed some increased T2 signal in the bone. Here for a cast, we haven't done a good job thus far with immobilization.   Objective: General: Well-developed, well-nourished, and in no acute distress. Right hand: Still with swelling and pain, lesser so though at the right fourth PIP.  Exos boxers cast placed.  Assessment/plan:   Sprain of right fourth PIP radial collateral ligament Exos boxer's fracture cast placed. We will to keep on for 2-3 weeks.

## 2016-12-20 DIAGNOSIS — Z0000 Encounter for general adult medical examination without abnormal findings: Secondary | ICD-10-CM | POA: Diagnosis not present

## 2016-12-20 DIAGNOSIS — E782 Mixed hyperlipidemia: Secondary | ICD-10-CM | POA: Diagnosis not present

## 2016-12-29 ENCOUNTER — Encounter: Payer: Self-pay | Admitting: Sports Medicine

## 2016-12-29 ENCOUNTER — Ambulatory Visit (INDEPENDENT_AMBULATORY_CARE_PROVIDER_SITE_OTHER): Payer: 59 | Admitting: Sports Medicine

## 2016-12-29 DIAGNOSIS — S63619D Unspecified sprain of unspecified finger, subsequent encounter: Secondary | ICD-10-CM | POA: Diagnosis not present

## 2016-12-29 DIAGNOSIS — S63601D Unspecified sprain of right thumb, subsequent encounter: Secondary | ICD-10-CM

## 2016-12-29 DIAGNOSIS — M67911 Unspecified disorder of synovium and tendon, right shoulder: Secondary | ICD-10-CM | POA: Diagnosis not present

## 2016-12-29 DIAGNOSIS — R635 Abnormal weight gain: Secondary | ICD-10-CM | POA: Diagnosis not present

## 2016-12-29 MED ORDER — PHENTERMINE HCL 37.5 MG PO TABS: ORAL_TABLET | ORAL | 0 refills | Status: DC

## 2016-12-29 NOTE — Progress Notes (Signed)
  Subjective:    CC: Followup  HPI: Finger pain: This is a pleasant 31 year old male, he returns, he had a severe sprain of the right  fourth PIP, MRI showed some surrounding bony edema but no evidence of rupture of the collaterals or the central slip. He has been in a cast on and off for 3 weeks, but tells me his compliance has been minimal. At this point he is eager to get rid of it.  Obesity: Desires to restart weight loss medication.  Right shoulder pain: Worse with overhead activities, localized over the deltoid, moderate, persistent.  Past medical history:  Negative.  See flowsheet/record as well for more information.  Surgical history: Negative.  See flowsheet/record as well for more information.  Family history: Negative.  See flowsheet/record as well for more information.  Social history: Negative.  See flowsheet/record as well for more information.  Allergies, and medications have been entered into the medical record, reviewed, and no changes needed.   Review of Systems: No fevers, chills, night sweats, weight loss, chest pain, or shortness of breath.   Objective:    General: Well Developed, well nourished, and in no acute distress.  Neuro: Alert and oriented x3, extra-ocular muscles intact, sensation grossly intact.  HEENT: Normocephalic, atraumatic, pupils equal round reactive to light, neck supple, no masses, no lymphadenopathy, thyroid nonpalpable.  Skin: Warm and dry, no rashes. Cardiac: Regular rate and rhythm, no murmurs rubs or gallops, no lower extremity edema.  Respiratory: Clear to auscultation bilaterally. Not using accessory muscles, speaking in full sentences. Right fourth PIP, still swollen, tender, some pain with flexion past about 70, no evidence of boutonniere's deformity, collaterals are stable. Right Shoulder: Inspection reveals no abnormalities, atrophy or asymmetry. Palpation is normal with no tenderness over AC joint or bicipital groove. ROM is full in  all planes. Rotator cuff strength normal throughout. Positive Neer and Hawkin's tests, empty can. Speeds and Yergason's tests normal. No labral pathology noted with negative Obrien's, negative crank, negative clunk, and good stability. Normal scapular function observed. No painful arc and no drop arm sign. No apprehension sign  Impression and Recommendations:    Abnormal weight gain Refilling phentermine.   Sprain of right fourth PIP radial collateral ligament Discontinue cast, start range of motion exercises. MRI was negative with the exception of some increased T2 signal in the bone consistent with bony contusion.  Dysfunction of right rotator cuff Starting formal physical therapy.

## 2016-12-29 NOTE — Assessment & Plan Note (Signed)
Starting formal physical therapy.

## 2016-12-29 NOTE — Assessment & Plan Note (Signed)
Refilling phentermine. 

## 2016-12-29 NOTE — Assessment & Plan Note (Signed)
Discontinue cast, start range of motion exercises. MRI was negative with the exception of some increased T2 signal in the bone consistent with bony contusion.

## 2017-01-29 ENCOUNTER — Encounter: Payer: Self-pay | Admitting: Physical Therapy

## 2017-01-29 ENCOUNTER — Ambulatory Visit: Payer: 59 | Attending: Sports Medicine | Admitting: Physical Therapy

## 2017-01-29 DIAGNOSIS — M25511 Pain in right shoulder: Secondary | ICD-10-CM | POA: Diagnosis present

## 2017-01-29 DIAGNOSIS — M62838 Other muscle spasm: Secondary | ICD-10-CM

## 2017-01-29 DIAGNOSIS — M25611 Stiffness of right shoulder, not elsewhere classified: Secondary | ICD-10-CM | POA: Diagnosis present

## 2017-01-29 DIAGNOSIS — M6281 Muscle weakness (generalized): Secondary | ICD-10-CM | POA: Diagnosis present

## 2017-01-29 NOTE — Patient Instructions (Addendum)
   ELASTIC BAND BILATERAL EXTERNAL ROTATION - ER  While holding an elastic band with your elbows bent, pull your hands away from your stomach area. Keep  your elbows near the side of your body.    Pec Stretch on Foam Roller   Laying on a foam roller, keep your knees bent and arms out to the side. The stretch can be changed by moving arms at different angles   Trigger Point Dry Needling  . What is Trigger Point Dry Needling (DN)? o DN is a physical therapy technique used to treat muscle pain and dysfunction. Specifically, DN helps deactivate muscle trigger points (muscle knots).  o A thin filiform needle is used to penetrate the skin and stimulate the underlying trigger point. The goal is for a local twitch response (LTR) to occur and for the trigger point to relax. No medication of any kind is injected during the procedure.   . What Does Trigger Point Dry Needling Feel Like?  o The procedure feels different for each individual patient. Some patients report that they do not actually feel the needle enter the skin and overall the process is not painful. Very mild bleeding may occur. However, many patients feel a deep cramping in the muscle in which the needle was inserted. This is the local twitch response.   Marland Kitchen. How Will I feel after the treatment? o Soreness is normal, and the onset of soreness may not occur for a few hours. Typically this soreness does not last longer than two days.  o Bruising is uncommon, however; ice can be used to decrease any possible bruising.  o In rare cases feeling tired or nauseous after the treatment is normal. In addition, your symptoms may get worse before they get better, this period will typically not last longer than 24 hours.   . What Can I do After My Treatment? o Increase your hydration by drinking more water for the next 24 hours. o You may place ice or heat on the areas treated that have become sore, however, do not use heat on inflamed or bruised  areas. Heat often brings more relief post needling. o You can continue your regular activities, but vigorous activity is not recommended initially after the treatment for 24 hours. o DN is best combined with other physical therapy such as strengthening, stretching, and other therapies.    [facility] [address], [address] [place], [place] [address] Phone # [phone] Fax [phone]

## 2017-01-29 NOTE — Therapy (Signed)
[facility] [address], [address] [place], [place], [address] Phone: [phone]   Fax:  [phone]  Physical Therapy Evaluation  Patient Details  Name: Craig Stewart MRN: [id number] Date of Birth: 12-Nov-1985 Referring Provider: Monica Becton  Encounter Date: 01/29/2017      PT End of Session - 01/29/17 0910    Visit Number 1   Date for PT Re-Evaluation 03/12/17   PT Start Time 0917   PT Stop Time 0951   PT Time Calculation (min) 34 min   Activity Tolerance Patient tolerated treatment well   Behavior During Therapy [facility] for tasks assessed/performed      History reviewed. No pertinent past medical history.  Past Surgical History:  Procedure Laterality Date  . MANDIBULAR HARDWARE REMOVAL N/A 07/13/2014   Procedure: MANDIBULAR HARDWARE REMOVAL;  Surgeon: Serena Colonel, MD;  Location: [facility];  Service: ENT;  Laterality: N/A;  . ORIF MANDIBULAR FRACTURE N/A 05/30/2014   Procedure: OPEN REDUCTION INTERNAL FIXATION (ORIF) MANDIBULAR FRACTURE;  Surgeon: Serena Colonel, MD;  Location: [facility] OR;  Service: ENT;  Laterality: N/A;    There were no vitals filed for this visit.       Subjective Assessment - 01/29/17 0918    Subjective Patient having pain with certain movements.  Has been using lacross ball for massage and it has gotten better.  Injured playing volleyball.  Reaching back and rotating in. Still playing volleyball.   Limitations Other (comment)  reaching back   Patient Stated Goals try to get rid of the pain   Currently in Pain? Yes   Pain Score 3    Pain Location Shoulder   Pain Orientation Right   Pain Descriptors / Indicators Sharp   Pain Type Acute pain   Pain Onset 1 to 4 weeks ago   Pain Frequency Intermittent   Aggravating Factors  playing volleyball, reaching back   Pain Relieving Factors get out of the painful position   Effect of Pain on Daily Activities stop pain during  volleyball   Multiple Pain Sites No            [facility] PT Assessment - 01/29/17 0001      Assessment   Medical Diagnosis M67.911 (ICD-10-CM) - Dysfunction of right rotator cuff   Referring Provider Rodney Langton J   Onset Date/Surgical Date 01/08/17  approximate date   Hand Dominance Right   Prior Therapy no     Precautions   Precautions None     Restrictions   Weight Bearing Restrictions No     Balance Screen   Has the patient fallen in the past 6 months No   Has the patient had a decrease in activity level because of a fear of falling?  No   Is the patient reluctant to leave their home because of a fear of falling?  No     Home [employer] information centre manager residence   Living Arrangements Alone     Prior Function   Level of Independence Independent   Vocation Full time employment   Vocation Requirements computer   Leisure volleyball     Cognition   Overall Cognitive Status Within Functional Limits for tasks assessed     Observation/Other Assessments   Focus on Therapeutic Outcomes (FOTO)  33% limited  goal 18% limited     Posture/Postural Control   Posture/Postural Control Postural limitations   Postural Limitations Rounded Shoulders     ROM / Strength  AROM / PROM / Strength AROM;Strength     AROM   Overall AROM  Within functional limits for tasks performed   Overall AROM Comments painful to reach back horizontal abduciton     Strength   Overall Strength Within functional limits for tasks performed   Overall Strength Comments painfree     Flexibility   Soft Tissue Assessment /Muscle Length --  pecs     Palpation   Palpation comment right shoulder anteriorly displaced, hypomobile GH joint capsule; rhomboid, infraspinatus     Ambulation/Gait   Gait Pattern Within Functional Limits            Objective measurements completed on examination: See above findings.          [facility] Adult PT Treatment/Exercise - 01/29/17  0001      Exercises   Exercises --  HEP as shown in chart                PT Education - 01/29/17 0953    Education provided Yes   Education Details external rotation, pec stretch to perform throughout the day; dry needling education   Person(s) Educated Patient   Methods Explanation;Demonstration;Verbal cues;Handout   Comprehension Verbalized understanding;Returned demonstration          PT Short Term Goals - 01/29/17 1052      PT SHORT TERM GOAL #1   Title pt will be independent with initial HEP   Time 3   Period Weeks   Status New     PT SHORT TERM GOAL #2   Title pt will report 50% reduced pain when playing volleyball due to reduced muscle spams   Time 3   Period Weeks   Status New           PT Long Term Goals - 01/29/17 1056      PT LONG TERM GOAL #1   Title pt will report no pain during volleyball due to increased shoulder strength and stability   Time 6   Period Weeks   Status New     PT LONG TERM GOAL #2   Title FOTO < or = 18% limited   Time 6   Period Weeks   Status New     PT LONG TERM GOAL #3   Title pt independent with advanced HEP   Time 6   Period Weeks   Status New     PT LONG TERM GOAL #4   Title pt will have no pain when reaching back due to reduced muscle spasms   Time 6   Period Weeks   Status New                Plan - 01/29/17 1035    Clinical Impression Statement Patient is active 31 y/o male.  Patient presents to clinic after an injury to his shoulder when spiking the volleyball during a game.  Pt has been having shoulder pain in certain positions ever since.  He presents with some hypomobility and RTC weakness in right shoulder.  Muscle spasms throughout right shoulder including deltoids, RTC, rhomboids, and pecs.  Pt has not had therapy but has had some relief from self massage and massage therapy.  Pt has postural deficits including rounded shoulders and works at [employer] job where he sits and works on [employer]im[employer]  all day.  Pt has painful AROM when reaching back into horizontal abduction past 90 degrees and end range of internal roation with arm in abduction.  Pt  will benefit from skilled PT to address impairment and return to normal activities.   History and Personal Factors relevant to plan of care: n/a   Clinical Presentation Stable   Clinical Presentation due to: patient's presentation is stable   Clinical Decision Making Low   PT Frequency 2x / week   PT Duration 6 weeks   PT Treatment/Interventions ADLs/Self Care Home Management;Biofeedback;Electrical Stimulation;Iontophoresis 4mg /ml Dexamethasone;Moist Heat;Traction;Ultrasound;Dry needling;Taping;Passive range of motion;Neuromuscular re-education;Therapeutic activities;Therapeutic exercise;Manual techniques;Patient/family education   PT Next Visit Plan manual, dry needling RTC, deltoids, pecs,    Recommended Other Services n/a   Consulted and Agree with Plan of Care Patient      Patient will benefit from skilled therapeutic intervention in order to improve the following deficits and impairments:  Pain, Postural dysfunction, Increased muscle spasms, Decreased strength  Visit Diagnosis: Acute pain of right shoulder  Muscle weakness (generalized)  Stiffness of right shoulder, not elsewhere classified  Other muscle spasm     Problem List Patient Active Problem List   Diagnosis Date Noted  . Dysfunction of right rotator cuff 12/29/2016  . Sprain of right fourth PIP radial collateral ligament 10/20/2016  . Strain of right Achilles tendon 02/29/2016  . Abnormal weight gain 02/29/2016  . Sprain of MCL joint of knee 10/05/2015  . Mandible fracture (HCC) 05/30/2014    [name], PT 01/29/2017, 1:33 PM  [facility] [address], [address] [place], [place], [address] Phone: [phone]   Fax:  [phone]  Name: Craig Stewart MRN: [id number] Date of Birth: 10-03-1985

## 2017-02-09 ENCOUNTER — Ambulatory Visit: Payer: 59 | Admitting: Physical Therapy

## 2017-02-09 DIAGNOSIS — M62838 Other muscle spasm: Secondary | ICD-10-CM

## 2017-02-09 DIAGNOSIS — M25511 Pain in right shoulder: Secondary | ICD-10-CM | POA: Diagnosis not present

## 2017-02-09 DIAGNOSIS — M6281 Muscle weakness (generalized): Secondary | ICD-10-CM

## 2017-02-09 DIAGNOSIS — M25611 Stiffness of right shoulder, not elsewhere classified: Secondary | ICD-10-CM

## 2017-02-09 NOTE — Therapy (Signed)
[facility] [address], [address] [place], [place], [address] Phone: [phone]   Fax:  [phone]  Physical Therapy Treatment  Patient Details  Name: Craig Stewart MRN: [id number] Date of Birth: [birth year] Referring Provider: Monica BectonHEKKEKANDAM, THOMAS J  Encounter Date: 02/09/2017      PT End of Session - 02/09/17 0809    Visit Number 2   Date for PT Re-Evaluation 03/12/17   PT Start Time 0804   PT Stop Time 0846   PT Time Calculation (min) 42 min   Activity Tolerance Patient tolerated treatment well   Behavior During Therapy [facility] for tasks assessed/performed      No past medical history on file.  Past Surgical History:  Procedure Laterality Date  . MANDIBULAR HARDWARE REMOVAL N/A 07/13/2014   Procedure: MANDIBULAR HARDWARE REMOVAL;  Surgeon: Serena ColonelJefry Rosen, MD;  Location: [facility];  Service: ENT;  Laterality: N/A;  . ORIF MANDIBULAR FRACTURE N/A 05/30/2014   Procedure: OPEN REDUCTION INTERNAL FIXATION (ORIF) MANDIBULAR FRACTURE;  Surgeon: Serena ColonelJefry Rosen, MD;  [facility];  Service: ENT;  Laterality: N/A;    There were no vitals filed for this visit.      Subjective Assessment - 02/09/17 0808    Subjective I have been doing the stretches a little bit but not every day.     Patient Stated Goals try to get rid of the pain   Currently in Pain? No/denies                         [facility] Adult PT Treatment/Exercise - 02/09/17 0001      Self-Care   Self-Care Posture   Posture education on sitting posture and towel roll to avoid sacral sitting     Exercises   Exercises Shoulder     Shoulder Exercises: ROM/Strengthening   UBE (Upper Arm Bike) L 10; 3 min fwd/ 3 min back   Other ROM/Strengthening Exercises rotation - internal and external with green band - 30x each way     Manual Therapy   Manual Therapy Soft tissue mobilization   Soft tissue mobilization pecs, RTC, rhomboids           Trigger Point Dry Needling - 02/09/17 0909    Consent Given? Yes   Education Handout Provided Yes   Muscles Treated Upper Body Supraspinatus;Infraspinatus;Rhomboids   Rhomboids Response Twitch response elicited;Palpable increased muscle length   Supraspinatus Response Twitch response elicited;Palpable increased muscle length   Infraspinatus Response Twitch response elicited;Palpable increased muscle length              PT Education - 02/09/17 0858    Education provided Yes   Education Details internal and external rotation exercise, posture education   Person(s) Educated Patient   Methods Explanation;Handout;Demonstration   Comprehension Verbalized understanding;Returned demonstration          PT Short Term Goals - 02/09/17 0907      PT SHORT TERM GOAL #1   Title pt will be independent with initial HEP   Time 3   Period Weeks   Status On-going     PT SHORT TERM GOAL #2   Title pt will report 50% reduced pain when playing volleyball due to reduced muscle spams   Baseline no change   Time 3   Period Weeks   Status On-going           PT Long Term Goals - 01/29/17 1056  PT LONG TERM GOAL #1   Title pt will report no pain during volleyball due to increased shoulder strength and stability   Time 6   Period Weeks   Status New     PT LONG TERM GOAL #2   Title FOTO < or = 18% limited   Time 6   Period Weeks   Status New     PT LONG TERM GOAL #3   Title pt independent with advanced HEP   Time 6   Period Weeks   Status New     PT LONG TERM GOAL #4   Title pt will have no pain when reaching back due to reduced muscle spasms   Time 6   Period Weeks   Status New               Plan - 02/09/17 1610    Clinical Impression Statement Reviewed posture with UBE exercise, pt had difficulty trying not to sacral sit.  Pt had good respsonse to manual and dry needling.  Pt still had some point tenderness at RTC attachments.  Pt needs skilled PT for  increased shoulder stability   PT Treatment/Interventions ADLs/Self Care Home Management;Biofeedback;Electrical Stimulation;Iontophoresis 4mg /ml Dexamethasone;Moist Heat;Traction;Ultrasound;Dry needling;Taping;Passive range of motion;Neuromuscular re-education;Therapeutic activities;Therapeutic exercise;Manual techniques;Patient/family education   PT Next Visit Plan fu with manual, dry needling #1 RTC, deltoids, pecs as needed, shoulder stability exercises including body blade, pt would like massage therapist rec   Consulted and Agree with Plan of Care Patient      Patient will benefit from skilled therapeutic intervention in order to improve the following deficits and impairments:  Pain, Postural dysfunction, Increased muscle spasms, Decreased strength  Visit Diagnosis: Acute pain of right shoulder  Muscle weakness (generalized)  Stiffness of right shoulder, not elsewhere classified  Other muscle spasm     Problem List Patient Active Problem List   Diagnosis Date Noted  . Dysfunction of right rotator cuff 12/29/2016  . Sprain of right fourth PIP radial collateral ligament 10/20/2016  . Strain of right Achilles tendon 02/29/2016  . Abnormal weight gain 02/29/2016  . Sprain of MCL joint of knee 10/05/2015  . Mandible fracture (HCC) 05/30/2014    Alphonsa Overall 02/09/2017, 9:27 AM  [facility] [address], [address] [place], [place], [address] Phone: [phone]   Fax:  [phone]  Name: Steffen Hase MRN: [id number] Date of Birth: 1986-02-24

## 2017-02-09 NOTE — Patient Instructions (Signed)
Posture - Standing   Good posture is important. Avoid slouching and forward head thrust. Maintain curve in low back and align ears over shoulders, hips over ankles.  Pull your belly button in toward your back bone. Posture Tips DO: - stand tall and erect - keep chin tucked in - keep head and shoulders in alignment - check posture regularly in mirror or large window - pull head back against headrest in car seat;  Change your position often.  Sit with lumbar support. DON'T: - slouch or slump while watching TV or reading - sit, stand or lie in one position  for too long;  Sitting is especially hard on the spine so if you sit at a desk/use the computer, then stand up often! Copyright  VHI. All rights reserved.  Posture - Sitting  Sit upright, head facing forward. Try using a roll to support lower back. Keep shoulders relaxed, and avoid rounded back. Keep hips level with knees. Avoid crossing legs for long periods. Copyright  VHI. All rights reserved.  Chronic neck strain can develop because of poor posture and faulty work habits  Postural strain related to slumped sitting and forward head posture is a leading cause of headaches, neck and upper back pain  General strengthening and flexibility exercises are helpful in the treatment of neck pain.  Most importantly, you should learn to correct the posture that may be contributing to chronic pain.   Change positions frequently  Change your work or home environment to improve posture and mechanics.    

## 2017-02-14 ENCOUNTER — Ambulatory Visit: Payer: 59 | Admitting: Physical Therapy

## 2017-02-14 ENCOUNTER — Encounter: Payer: Self-pay | Admitting: Physical Therapy

## 2017-02-14 DIAGNOSIS — M25611 Stiffness of right shoulder, not elsewhere classified: Secondary | ICD-10-CM

## 2017-02-14 DIAGNOSIS — M25511 Pain in right shoulder: Secondary | ICD-10-CM

## 2017-02-14 DIAGNOSIS — M6281 Muscle weakness (generalized): Secondary | ICD-10-CM

## 2017-02-14 DIAGNOSIS — M62838 Other muscle spasm: Secondary | ICD-10-CM

## 2017-02-14 NOTE — Therapy (Signed)
[facility] [address], [address] [place], [place], [address] Phone: [phone]   Fax:  [phone]  Physical Therapy Treatment  Patient Details  Name: Craig Stewart MRN: [id number] Date of Birth: 1985-09-24 Referring Provider: Monica BectonHEKKEKANDAM, THOMAS J  Encounter Date: 02/14/2017      PT End of Session - 02/14/17 1236    Visit Number 3   Date for PT Re-Evaluation 03/12/17   PT Start Time 1233   Activity Tolerance Patient tolerated treatment well   Behavior During Therapy [facility] for tasks assessed/performed      History reviewed. No pertinent past medical history.  Past Surgical History:  Procedure Laterality Date  . MANDIBULAR HARDWARE REMOVAL N/A 07/13/2014   Procedure: MANDIBULAR HARDWARE REMOVAL;  Surgeon: Serena ColonelJefry Rosen, MD;  Location: [facility];  Service: ENT;  Laterality: N/A;  . ORIF MANDIBULAR FRACTURE N/A 05/30/2014   Procedure: OPEN REDUCTION INTERNAL FIXATION (ORIF) MANDIBULAR FRACTURE;  Surgeon: Serena ColonelJefry Rosen, MD;  [facility];  Service: ENT;  Laterality: N/A;    There were no vitals filed for this visit.      Subjective Assessment - 02/14/17 1234    Subjective It feels about the same in volleyball.  I go to cross fit and cant do pull ups or hang punching forward with internal rotation hurts.   Patient Stated Goals try to get rid of the pain   Currently in Pain? No/denies                         [facility] - 02/14/17 0001      Shoulder Exercises: ROM/Strengthening   UBE (Upper Arm Bike) L 10; 3 min fwd/ 3 min back     Manual Therapy   Manual Therapy Soft tissue mobilization   Soft tissue mobilization pecs, RTC, rhomboids, deltoids          Trigger Point Dry Needling - 02/14/17 1318    Muscles Treated Upper Body --  deltoids, teresminor   Rhomboids Response Twitch response elicited;Palpable increased muscle length   Supraspinatus Response  Twitch response elicited;Palpable increased muscle length   Infraspinatus Response Twitch response elicited;Palpable increased muscle length   Subscapularis Response Twitch response elicited;Palpable increased muscle length                PT Short Term Goals - 02/14/17 1237      PT SHORT TERM GOAL #1   Title pt will be independent with initial HEP   Time 3   Period Weeks   Status On-going     PT SHORT TERM GOAL #2   Title pt will report 50% reduced pain when playing volleyball due to reduced muscle spams   Baseline no change   Time 3   Period Weeks   Status On-going           PT Long Term Goals - 02/14/17 1326      PT LONG TERM GOAL #1   Title pt will report no pain during volleyball due to increased shoulder strength and stability   Time 6   Period Weeks   Status On-going     PT LONG TERM GOAL #2   Title FOTO < or = 18% limited   Time 6   Period Weeks   Status On-going     PT LONG TERM GOAL #3   Title pt independent with advanced HEP   Time 6   Period Weeks  Status On-going     PT LONG TERM GOAL #4   Title pt will have no pain when reaching back due to reduced muscle spasms   Time 6   Period Weeks   Status On-going               Plan - 02/14/17 1324    Clinical Impression Statement Patient still having pain with activities and did not notice changes from initial treatment.  Focused on different areas including deltoids, subscap, and teres minor.  Trigger points were found throughout.  Pt will need skilled PT to work on lengthening tight muscle tissue and increased shoulder stability.   PT Treatment/Interventions ADLs/Self Care Home Management;Biofeedback;Electrical Stimulation;Iontophoresis 4mg /ml Dexamethasone;Moist Heat;Traction;Ultrasound;Dry needling;Taping;Passive range of motion;Neuromuscular re-education;Therapeutic activities;Therapeutic exercise;Manual techniques;Patient/family education   PT Next Visit Plan f/u with response to  manual, dry needling #2 RTC, deltoids, pecs as needed, shoulder stability exercises including body blade, taping to RTC   Consulted and Agree with Plan of Care Patient      Patient will benefit from skilled therapeutic intervention in order to improve the following deficits and impairments:  Pain, Postural dysfunction, Increased muscle spasms, Decreased strength  Visit Diagnosis: Acute pain of right shoulder  Muscle weakness (generalized)  Stiffness of right shoulder, not elsewhere classified  Other muscle spasm     Problem List Patient Active Problem List   Diagnosis Date Noted  . Dysfunction of right rotator cuff 12/29/2016  . Sprain of right fourth PIP radial collateral ligament 10/20/2016  . Strain of right Achilles tendon 02/29/2016  . Abnormal weight gain 02/29/2016  . Sprain of MCL joint of knee 10/05/2015  . Mandible fracture (HCC) 05/30/2014    [name], PT 02/14/2017, 1:29 PM  [facility] [address], [address] [place], [place], [address] Phone: [phone]   Fax:  [phone]  Name: Craig Stewart MRN: [id number] Date of Birth: 01-27-1986

## 2017-02-22 ENCOUNTER — Ambulatory Visit: Payer: 59 | Attending: Sports Medicine | Admitting: Physical Therapy

## 2017-02-22 ENCOUNTER — Telehealth: Payer: Self-pay | Admitting: Physical Therapy

## 2017-02-22 DIAGNOSIS — M62838 Other muscle spasm: Secondary | ICD-10-CM | POA: Insufficient documentation

## 2017-02-22 DIAGNOSIS — M25511 Pain in right shoulder: Secondary | ICD-10-CM | POA: Insufficient documentation

## 2017-02-22 DIAGNOSIS — M6281 Muscle weakness (generalized): Secondary | ICD-10-CM | POA: Insufficient documentation

## 2017-02-22 DIAGNOSIS — M25611 Stiffness of right shoulder, not elsewhere classified: Secondary | ICD-10-CM | POA: Insufficient documentation

## 2017-02-22 NOTE — Telephone Encounter (Signed)
Called and LM due to no show.  [name], PT 02/22/17 4:47 PM

## 2017-02-26 DIAGNOSIS — R399 Unspecified symptoms and signs involving the genitourinary system: Secondary | ICD-10-CM | POA: Diagnosis not present

## 2017-03-02 ENCOUNTER — Ambulatory Visit: Payer: 59 | Admitting: Physical Therapy

## 2017-03-02 DIAGNOSIS — M6281 Muscle weakness (generalized): Secondary | ICD-10-CM | POA: Diagnosis present

## 2017-03-02 DIAGNOSIS — M62838 Other muscle spasm: Secondary | ICD-10-CM

## 2017-03-02 DIAGNOSIS — M25611 Stiffness of right shoulder, not elsewhere classified: Secondary | ICD-10-CM

## 2017-03-02 DIAGNOSIS — M25511 Pain in right shoulder: Secondary | ICD-10-CM

## 2017-03-02 NOTE — Therapy (Signed)
[facility] [address] [place], [place], [address] Phone: [phone]   Fax:  [phone]  Physical Therapy Treatment  Patient Details  Name: Craig Stewart MRN: [id number] Date of Birth: [birth year] Referring Provider: Monica Becton  Encounter Date: 03/02/2017      PT End of Session - 03/02/17 0811    Visit Number 4   Date for PT Re-Evaluation 03/12/17   PT Start Time 0807  pt arrived late, dry needling   PT Stop Time 0847   PT Time Calculation (min) 40 min   Activity Tolerance Patient tolerated treatment well   Behavior During Therapy [facility] for tasks assessed/performed      No past medical history on file.  Past Surgical History:  Procedure Laterality Date  . MANDIBULAR HARDWARE REMOVAL N/A 07/13/2014   Procedure: MANDIBULAR HARDWARE REMOVAL;  Surgeon: Serena Colonel, MD;  Location: [facility];  Service: ENT;  Laterality: N/A;  . ORIF MANDIBULAR FRACTURE N/A 05/30/2014   Procedure: OPEN REDUCTION INTERNAL FIXATION (ORIF) MANDIBULAR FRACTURE;  Surgeon: Serena Colonel, MD;  Location: [facility];  Service: ENT;  Laterality: N/A;    There were no vitals filed for this visit.      Subjective Assessment - 03/02/17 0808    Subjective It feels like it is getting better but it's not there yet.  Pt demonstrates pain with internal rotation of right shoulder.  Pull ups, hanging and push ups are still hurting.   Patient Stated Goals try to get rid of the pain   Currently in Pain? No/denies                         [facility] Adult PT Treatment/Exercise - 03/02/17 0001      Shoulder Exercises: Supine   Protraction Strengthening;Right;15 reps;Weights  2 sets   Protraction Weight (lbs) 7     Shoulder Exercises: Sidelying   External Rotation Strengthening;Right;15 reps;Weights   External Rotation Weight (lbs) 5     Shoulder Exercises: ROM/Strengthening   UBE (Upper Arm Bike) L 10; 3 min  fwd/ 3 min back     Shoulder Exercises: Body Blade   Flexion 15 seconds;3 reps   External Rotation 3 reps;15 seconds     Manual Therapy   Manual Therapy Soft tissue mobilization;Taping   Soft tissue mobilization pecs, RTC, rhomboids, deltoids   Kinesiotex Facilitate Muscle  Left RTC          Trigger Point Dry Needling - 03/02/17 0831    Consent Given? Yes   Muscles Treated Upper Body Levator scapulae;Upper trapezius   Subscapularis Response Twitch response elicited;Palpable increased muscle length              PT Education - 03/02/17 0944    Education provided Yes   Education Details sidelying ER, supine serratus punch   Person(s) Educated Patient   Methods Explanation;Demonstration;Verbal cues;Handout   Comprehension Verbalized understanding;Returned demonstration          PT Short Term Goals - 03/02/17 6962      PT SHORT TERM GOAL #1   Title pt will be independent with initial HEP   Time 3   Period Weeks   Status Achieved     PT SHORT TERM GOAL #2   Title pt will report 50% reduced pain when playing volleyball due to reduced muscle spams   Baseline getting better   Time 3   Period Weeks   Status On-going  PT Long Term Goals - 02/14/17 1326      PT LONG TERM GOAL #1   Title pt will report no pain during volleyball due to increased shoulder strength and stability   Time 6   Period Weeks   Status On-going     PT LONG TERM GOAL #2   Title FOTO < or = 18% limited   Time 6   Period Weeks   Status On-going     PT LONG TERM GOAL #3   Title pt independent with advanced HEP   Time 6   Period Weeks   Status On-going     PT LONG TERM GOAL #4   Title pt will have no pain when reaching back due to reduced muscle spasms   Time 6   Period Weeks   Status On-going               Plan - 03/02/17 [id number]    Clinical Impression Statement Patient demonstrates fatigue with shoulder and scap stability exercises.  He has made some progress  with manual and dry needling but will need skilled therapy to work on shoulder stabilty for improved shoulder positioning with functional activities.   PT Treatment/Interventions ADLs/Self Care Home Management;Biofeedback;Electrical Stimulation;Iontophoresis 4mg /ml Dexamethasone;Moist Heat;Traction;Ultrasound;Dry needling;Taping;Passive range of motion;Neuromuscular re-education;Therapeutic activities;Therapeutic exercise;Manual techniques;Patient/family education   PT Next Visit Plan f/u with response to manual, dry needling #3 and taping, cont  manual to RTC, deltoids, pecs as needed, shoulder stability exercises including body blade   Consulted and Agree with Plan of Care Patient      Patient will benefit from skilled therapeutic intervention in order to improve the following deficits and impairments:  Pain, Postural dysfunction, Increased muscle spasms, Decreased strength  Visit Diagnosis: Acute pain of right shoulder  Muscle weakness (generalized)  Other muscle spasm  Stiffness of right shoulder, not elsewhere classified     Problem List Patient Active Problem List   Diagnosis Date Noted  . Dysfunction of right rotator cuff 12/29/2016  . Sprain of right fourth PIP radial collateral ligament 10/20/2016  . Strain of right Achilles tendon 02/29/2016  . Abnormal weight gain 02/29/2016  . Sprain of MCL joint of knee 10/05/2015  . Mandible fracture (HCC) 05/30/2014    [name], PT 03/02/2017, 9:44 AM  [facility] [address] [place], [place], [address] Phone: [phone]   Fax:  [phone]  Name: Craig Stewart MRN: [id number] Date of Birth: 17-Dec-1985

## 2017-03-02 NOTE — Patient Instructions (Signed)
   external Rotation Sidelying  - Lie on uninvolved side with the involved shoulder lying on folded towel at side - Keep the elbow bent and arm against towel throughout this exercise. - Lift your wrist and forearm away from your abdomen.  Slowly lower - Perform this motion only to a point that is pain-free.  Do 2 sets of 15 reps     2 sets of 15

## 2017-03-07 ENCOUNTER — Encounter: Payer: Self-pay | Admitting: Physical Therapy

## 2017-03-07 ENCOUNTER — Ambulatory Visit: Payer: 59 | Admitting: Physical Therapy

## 2017-03-07 DIAGNOSIS — M6281 Muscle weakness (generalized): Secondary | ICD-10-CM

## 2017-03-07 DIAGNOSIS — M25511 Pain in right shoulder: Secondary | ICD-10-CM | POA: Diagnosis not present

## 2017-03-07 DIAGNOSIS — M25611 Stiffness of right shoulder, not elsewhere classified: Secondary | ICD-10-CM

## 2017-03-07 DIAGNOSIS — M62838 Other muscle spasm: Secondary | ICD-10-CM

## 2017-03-07 NOTE — Therapy (Addendum)
[facility] [address] [place], [place], [address] Phone: [phone]   Fax:  [phone]  Physical Therapy Treatment  Patient Details  Name: Craig Stewart MRN: [id number] Date of Birth: August 04, 1986 Referring Provider: Silverio Decamp  Encounter Date: 03/07/2017      PT End of Session - 03/07/17 0852    Visit Number 5   Date for PT Re-Evaluation 03/12/17   PT Start Time 0849   PT Stop Time 0930   PT Time Calculation (min) 41 min   Activity Tolerance Patient tolerated treatment well   Behavior During Therapy [facility] for tasks assessed/performed      History reviewed. No pertinent past medical history.  Past Surgical History:  Procedure Laterality Date  . MANDIBULAR HARDWARE REMOVAL N/A 07/13/2014   Procedure: MANDIBULAR HARDWARE REMOVAL;  Surgeon: Izora Gala, MD;  Location: [place];  Service: ENT;  Laterality: N/A;  . ORIF MANDIBULAR FRACTURE N/A 05/30/2014   Procedure: OPEN REDUCTION INTERNAL FIXATION (ORIF) MANDIBULAR FRACTURE;  Surgeon: Izora Gala, MD;  Location: [place];  Service: ENT;  Laterality: N/A;    There were no vitals filed for this visit.      Subjective Assessment - 03/07/17 0854    Subjective Pt reports the taping helped.  States same things are hurting internal rotation with movements and end range of shoulder flexion.   Limitations Other (comment)  reaching   Patient Stated Goals try to get rid of the pain   Currently in Pain? No/denies                         [facility] Adult PT Treatment/Exercise - 03/07/17 0001      Shoulder Exercises: Standing   Horizontal ABduction Strengthening;Right  15# Power tower; 3x10   ABduction Strengthening;Right;Weights;20 reps  3lb (started with 5lb too heavy)     Shoulder Exercises: Pulleys   Other Pulley Exercises shoulder ER and daigonal - 25# 3x10     Shoulder Exercises: ROM/Strengthening   UBE (Upper Arm  Bike) L 10; 3 min fwd/ 3 min back     Manual Therapy   Manual Therapy Soft tissue mobilization;Taping   Soft tissue mobilization pecs, RTC, rhomboids, deltoids   Kinesiotex Facilitate Muscle  Left RTC          Trigger Point Dry Needling - 03/07/17 1348    Consent Given? Yes   Rhomboids Response Twitch response elicited;Palpable increased muscle length                PT Short Term Goals - 03/02/17 7412      PT SHORT TERM GOAL #1   Title pt will be independent with initial HEP   Time 3   Period Weeks   Status Achieved     PT SHORT TERM GOAL #2   Title pt will report 50% reduced pain when playing volleyball due to reduced muscle spams   Baseline getting better   Time 3   Period Weeks   Status On-going           PT Long Term Goals - 02/14/17 1326      PT LONG TERM GOAL #1   Title pt will report no pain during volleyball due to increased shoulder strength and stability   Time 6   Period Weeks   Status On-going     PT LONG TERM GOAL #2   Title FOTO < or = 18% limited  Time 6   Period Weeks   Status On-going     PT LONG TERM GOAL #3   Title pt independent with advanced HEP   Time 6   Period Weeks   Status On-going     PT LONG TERM GOAL #4   Title pt will have no pain when reaching back due to reduced muscle spasms   Time 6   Period Weeks   Status On-going               Plan - 03/07/17 1021    Clinical Impression Statement Pt had difficulty with shoulder abduction and horizontal abduction.  He had good response from tape and manual and continues to need skilled PT to work on improved shoulder stability.     PT Treatment/Interventions ADLs/Self Care Home Management;Biofeedback;Electrical Stimulation;Iontophoresis 47m/ml Dexamethasone;Moist Heat;Traction;Ultrasound;Dry needling;Taping;Passive range of motion;Neuromuscular re-education;Therapeutic activities;Therapeutic exercise;Manual techniques;Patient/family education   PT Next Visit Plan  f/u with response to manual, dry needling #4 and taping, cont  manual to RTC, anterior deltoids, pecs as needed, shoulder stability exercises including body blade, shoulder abd and horiz abd   Consulted and Agree with Plan of Care Patient      Patient will benefit from skilled therapeutic intervention in order to improve the following deficits and impairments:  Pain, Postural dysfunction, Increased muscle spasms, Decreased strength  Visit Diagnosis: Acute pain of right shoulder  Muscle weakness (generalized)  Other muscle spasm  Stiffness of right shoulder, not elsewhere classified     Problem List Patient Active Problem List   Diagnosis Date Noted  . Dysfunction of right rotator cuff 12/29/2016  . Sprain of right fourth PIP radial collateral ligament 10/20/2016  . Strain of right Achilles tendon 02/29/2016  . Abnormal weight gain 02/29/2016  . Sprain of MCL joint of knee 10/05/2015  . Mandible fracture ([place] 05/30/2014    JZannie Cove PT 03/07/2017, 1:51 PM  [facility] [address] [place] [address]Phone: [phone]  Fax:  [phone] Name: Craig GautreauMRN: [id number] of Birth: [birth year] PHYSICAL THERAPY DISCHARGE SUMMARY  Visits from Start of Care: 5  Current functional level related to goals / functional outcomes: See above   Remaining deficits: See above   Education / Equipment: HEP Plan: Patient agrees to discharge.  Patient goals were not met. Patient is being discharged due to meeting the stated rehab goals.  ?????         JGoogle PT 05/17/17 7:59 AM

## 2017-03-22 ENCOUNTER — Ambulatory Visit (INDEPENDENT_AMBULATORY_CARE_PROVIDER_SITE_OTHER): Payer: 59 | Admitting: Sports Medicine

## 2017-03-22 ENCOUNTER — Telehealth: Payer: Self-pay | Admitting: Sports Medicine

## 2017-03-22 ENCOUNTER — Encounter: Payer: Self-pay | Admitting: Sports Medicine

## 2017-03-22 DIAGNOSIS — R635 Abnormal weight gain: Secondary | ICD-10-CM | POA: Diagnosis not present

## 2017-03-22 DIAGNOSIS — H60332 Swimmer's ear, left ear: Secondary | ICD-10-CM

## 2017-03-22 DIAGNOSIS — H6092 Unspecified otitis externa, left ear: Secondary | ICD-10-CM | POA: Insufficient documentation

## 2017-03-22 MED ORDER — PHENTERMINE HCL 37.5 MG PO TABS: ORAL_TABLET | ORAL | 0 refills | Status: DC

## 2017-03-22 MED ORDER — CIPROFLOXACIN-DEXAMETHASONE 0.3-0.1 % OT SUSP: 4.0000 [drp] | Freq: Two times a day (BID) | OTIC | 0 refills | Status: AC

## 2017-03-22 NOTE — Telephone Encounter (Signed)
Disregard the prevs message pt has been scheduled for 03/23/17. Thanks

## 2017-03-22 NOTE — Assessment & Plan Note (Signed)
Starting Ciprodex. 

## 2017-03-22 NOTE — Assessment & Plan Note (Signed)
I'm going to give him a single additional try with phentermine.  Return in one month.

## 2017-03-22 NOTE — Telephone Encounter (Signed)
Patient called left vm to schedule a wt check and possible ear infec. Adv pt that next avail appt is 03/29/17 and pt asked if he could be doubled booked I adv that I would send a message. Please adv-

## 2017-03-22 NOTE — Progress Notes (Signed)
  Subjective:    CC: left ear pain  HPI: Patient reports left ear pain for 1 day. He states that he also feels like there is something in his ear and endorses a feeling of fullness. Patient reports swimming this past weekend. He denies any recent sinus congestion, allergies, or sore throat. Patient endorses having ear infections as a child, but has not had any for some time. Patient denies any ringing in his ears or changes in hearing.  Past medical history:  Negative.  See flowsheet/record as well for more information.  Surgical history: Negative.  See flowsheet/record as well for more information.  Family history: Negative.  See flowsheet/record as well for more information.  Social history: Negative.  See flowsheet/record as well for more information.  Allergies, and medications have been entered into the medical record, reviewed, and no changes needed.   Review of Systems: No fevers, chills, night sweats, weight loss, chest pain, or shortness of breath.   Objective:    General: Well Developed, well nourished, and in no acute distress.  Neuro: Alert and oriented x3, extra-ocular muscles intact, sensation grossly intact.  HEENT: Normocephalic, atraumatic, pupils equal round reactive to light, neck supple, no masses, no lymphadenopathy, thyroid nonpalpable. Erythema present in externa ear canal bilaterally. Clear, gray tympanic membranes without bulge, discharge or perforation, cone of light visible bilaterally. Skin: Warm and dry, no rashes. Cardiac: Regular rate and rhythm, no murmurs rubs or gallops, no lower extremity edema.  Respiratory: Clear to auscultation bilaterally. Not using accessory muscles, speaking in full sentences.   Impression and Recommendations:    31 year-old male with left ear pain. Given his physical exam, report of symptoms, and recent history of swimming, this is most likely otitis externa. He was prescribed ciprofloxacin drops and instructed to follow-up in  clinic if symptoms do not improve or worsen.

## 2017-03-23 ENCOUNTER — Ambulatory Visit: Payer: 59 | Admitting: Sports Medicine

## 2017-03-29 ENCOUNTER — Other Ambulatory Visit: Payer: Self-pay | Admitting: Sports Medicine

## 2017-03-29 ENCOUNTER — Encounter: Payer: Self-pay | Admitting: Sports Medicine

## 2017-03-29 MED ORDER — VALACYCLOVIR HCL 1 G PO TABS: 1000.0000 mg | ORAL_TABLET | Freq: Two times a day (BID) | ORAL | 2 refills | Status: DC

## 2017-04-06 DIAGNOSIS — T63441A Toxic effect of venom of bees, accidental (unintentional), initial encounter: Secondary | ICD-10-CM | POA: Diagnosis not present

## 2017-04-20 ENCOUNTER — Ambulatory Visit: Payer: 59 | Admitting: Sports Medicine

## 2017-04-20 DIAGNOSIS — Z0189 Encounter for other specified special examinations: Secondary | ICD-10-CM

## 2017-06-05 ENCOUNTER — Encounter: Payer: Self-pay | Admitting: Sports Medicine

## 2017-06-05 ENCOUNTER — Ambulatory Visit (INDEPENDENT_AMBULATORY_CARE_PROVIDER_SITE_OTHER): Payer: 59 | Admitting: Sports Medicine

## 2017-06-05 DIAGNOSIS — S76211A Strain of adductor muscle, fascia and tendon of right thigh, initial encounter: Secondary | ICD-10-CM | POA: Diagnosis not present

## 2017-06-05 DIAGNOSIS — T1591XA Foreign body on external eye, part unspecified, right eye, initial encounter: Secondary | ICD-10-CM | POA: Diagnosis not present

## 2017-06-05 MED ORDER — DICLOFENAC SODIUM 75 MG PO TBEC: 75.0000 mg | DELAYED_RELEASE_TABLET | Freq: Two times a day (BID) | ORAL | 3 refills | Status: AC

## 2017-06-05 NOTE — Assessment & Plan Note (Signed)
Strap with compressive dressing, Voltaren, rehabilitation exercises.  Return in 2-4 weeks.

## 2017-06-05 NOTE — Assessment & Plan Note (Signed)
Removal of a partial contact lens, soft from the right eye under tetracaine analgesia. Return as needed.

## 2017-06-05 NOTE — Progress Notes (Signed)
  Subjective:    CC: Multiple issues  HPI: Foreign body sensation in right eye: Present since yesterday, vision is okay. No trauma, no constitutional symptoms.  Right groin strain: Occur while working out, 2 weeks ago, persistent pain at the proximal hip adductor's, moderate, persistent without radiation.  Past medical history:  Negative.  See flowsheet/record as well for more information.  Surgical history: Negative.  See flowsheet/record as well for more information.  Family history: Negative.  See flowsheet/record as well for more information.  Social history: Negative.  See flowsheet/record as well for more information.  Allergies, and medications have been entered into the medical record, reviewed, and no changes needed.   Review of Systems: No fevers, chills, night sweats, weight loss, chest pain, or shortness of breath.   Objective:    General: Well Developed, well nourished, and in no acute distress.  Neuro: Alert and oriented x3, extra-ocular muscles intact, sensation grossly intact.  HEENT: Normocephalic, atraumatic, pupils equal round reactive to light, neck supple, no masses, no lymphadenopathy, thyroid nonpalpable.  Skin: Warm and dry, no rashes. Cardiac: Regular rate and rhythm, no murmurs rubs or gallops, no lower extremity edema.  Respiratory: Clear to auscultation bilaterally. Not using accessory muscles, speaking in full sentences. Right Hip: ROM IR: 60 Deg, ER: 60 Deg, Flexion: 120 Deg, Extension: 100 Deg, Abduction: 45 Deg, Adduction: 45 Deg Strength IR: 5/5, ER: 5/5, Flexion: 5/5, Extension: 5/5, Abduction: 5/5, Adduction: 5/5 Pelvic alignment unremarkable to inspection and palpation. Standing hip rotation and gait without trendelenburg / unsteadiness. Greater trochanter without tenderness to palpation. No tenderness over piriformis. No SI joint tenderness and normal minimal SI movement. Reproduction of pain with internal straight leg raise, tender to palpation  over the gracilis and mid hip adductor's.  Procedure:  Exploration and removal of right eye foreign body Risks, benefits, alternatives explained to patient. Consent obtained. Time out conducted. 3 drops of tetracaine were instilled into the right eye. I examined the entirety of the eye under direct magnification, I did find one half of a torn soft contact lens in the upper aspect of the eye between globe and palpebral conjunctiva.  This was removed without incident. No further foreign bodies were seen. Patient reported improvement in symptoms.  Impression and Recommendations:    Strain of groin, right, initial encounter Strap with compressive dressing, Voltaren, rehabilitation exercises.  Return in 2-4 weeks.  Foreign body, eye, right, initial encounter Removal of a partial contact lens, soft from the right eye under tetracaine analgesia. Return as needed.  ___________________________________________ Ihor Austin. Benjamin Stain, M.D., ABFM., CAQSM. Primary Care and Sports Medicine [facility]  Adjunct Instructor of Family Medicine  [employer]

## 2017-06-25 ENCOUNTER — Ambulatory Visit (INDEPENDENT_AMBULATORY_CARE_PROVIDER_SITE_OTHER): Payer: 59 | Admitting: Sports Medicine

## 2017-06-25 ENCOUNTER — Ambulatory Visit (INDEPENDENT_AMBULATORY_CARE_PROVIDER_SITE_OTHER): Payer: 59

## 2017-06-25 DIAGNOSIS — M545 Low back pain, unspecified: Secondary | ICD-10-CM

## 2017-06-25 DIAGNOSIS — T796XXA Traumatic ischemia of muscle, initial encounter: Secondary | ICD-10-CM | POA: Diagnosis not present

## 2017-06-25 DIAGNOSIS — M50321 Other cervical disc degeneration at C4-C5 level: Secondary | ICD-10-CM | POA: Diagnosis not present

## 2017-06-25 DIAGNOSIS — M542 Cervicalgia: Secondary | ICD-10-CM | POA: Diagnosis not present

## 2017-06-25 DIAGNOSIS — M47892 Other spondylosis, cervical region: Secondary | ICD-10-CM | POA: Diagnosis not present

## 2017-06-25 MED ORDER — PREDNISONE 50 MG PO TABS: ORAL_TABLET | ORAL | 0 refills | Status: DC

## 2017-06-25 MED ORDER — CYCLOBENZAPRINE HCL 10 MG PO TABS: ORAL_TABLET | ORAL | 0 refills | Status: AC

## 2017-06-25 MED ORDER — KETOROLAC TROMETHAMINE 30 MG/ML IJ SOLN
30.0000 mg | Freq: Once | INTRAMUSCULAR | Status: AC
  Administered 2017-06-25: 30 mg via INTRAMUSCULAR

## 2017-06-25 NOTE — Progress Notes (Addendum)
  Subjective:    CC: Low back pain  HPI: This is a pleasant 31 year old male, he did a very heavy Cross-Fit class, he then built a fire pit in his backyard, developed severe back pain, worse with flexion, movement, nothing radicular.  No bowel or bladder dysfunction, saddle numbness.  He is having great difficulty walking.  Past medical history:  Negative.  See flowsheet/record as well for more information.  Surgical history: Negative.  See flowsheet/record as well for more information.  Family history: Negative.  See flowsheet/record as well for more information.  Social history: Negative.  See flowsheet/record as well for more information.  Allergies, and medications have been entered into the medical record, reviewed, and no changes needed.   Review of Systems: No fevers, chills, night sweats, weight loss, chest pain, or shortness of breath.   Objective:    General: Well Developed, well nourished, and in no acute distress.  Neuro: Alert and oriented x3, extra-ocular muscles intact, sensation grossly intact.  HEENT: Normocephalic, atraumatic, pupils equal round reactive to light, neck supple, no masses, no lymphadenopathy, thyroid nonpalpable.  Skin: Warm and dry, no rashes. Cardiac: Regular rate and rhythm, no murmurs rubs or gallops, no lower extremity edema.  Respiratory: Clear to auscultation bilaterally. Not using accessory muscles, speaking in full sentences. Back Exam:  Inspection: Unremarkable  Motion: Motion is extremely limited in all directions SLR laying: Negative  XSLR laying: Negative  Palpable tenderness: None. FABER: negative. Sensory change: Gross sensation intact to all lumbar and sacral dermatomes.  Reflexes: 2+ at both patellar tendons, 2+ at achilles tendons, Babinski's downgoing.  Strength at foot  Plantar-flexion: 5/5 Dorsi-flexion: 5/5 Eversion: 5/5 Inversion: 5/5  Leg strength  Quad: 5/5 Hamstring: 5/5 Hip flexor: 5/5 Hip abductors: 5/5  Gait  unremarkable.  Lumbar spine and cervical spine x-rays personally reviewed there is straightening of the normal cervical lordosis with C5-C6 degenerative disc disease, there is also loss of the normal lumbar curvature with lumbarization of the sacrum.  I ended up drawing the blood myself here in the office from the right cubital vein.  Impression and Recommendations:    Acute low back pain After working hard, straining his back at a CrossFit class and then while building a fire pit. Pain out of proportion to degree of injury, adding CBC, CMP and CK levels. Cervical and lumbar pain Prednisone, Toradol 30 intramuscular in the office, Flexeril as needed. X-rays. Rehab exercises given, return in 2 weeks.  Rhabdomyolysis Lab testing does confirm mild to moderate rhabdomyolysis with CK levels almost 7000 and LFTs in the 100s, he should avoid alcohol until have cleared him, and until liver function tests are back to normal, renal function is okay, hydration should be aggressive, would like to see him back and recheck CK levels and liver function tests in a week.  ___________________________________________ Ihor Austin. Benjamin Stain, M.D., ABFM., CAQSM. Primary Care and Sports Medicine [facility]  Adjunct Instructor of Family Medicine  [employer]

## 2017-06-25 NOTE — Assessment & Plan Note (Signed)
After working hard, straining his back at a CrossFit class and then while building a fire pit. Pain out of proportion to degree of injury, adding CBC, CMP and CK levels. Cervical and lumbar pain Prednisone, Toradol 30 intramuscular in the office, Flexeril as needed. X-rays. Rehab exercises given, return in 2 weeks.

## 2017-06-26 LAB — COMPREHENSIVE METABOLIC PANEL
AG Ratio: 1.8 (calc) (ref 1.0–2.5)
AST: 138 U/L — ABNORMAL HIGH (ref 10–40)
Albumin: 4.8 g/dL (ref 3.6–5.1)
Alkaline phosphatase (APISO): 61 U/L (ref 40–115)
BUN: 19 mg/dL (ref 7–25)
Creat: 1.08 mg/dL (ref 0.60–1.35)
Potassium: 4.7 mmol/L (ref 3.5–5.3)
Total Protein: 7.4 g/dL (ref 6.1–8.1)

## 2017-06-26 LAB — COMPREHENSIVE METABOLIC PANEL WITH GFR
ALT: 109 U/L — ABNORMAL HIGH (ref 9–46)
CO2: 26 mmol/L (ref 20–32)
Calcium: 9.7 mg/dL (ref 8.6–10.3)
Chloride: 104 mmol/L (ref 98–110)
Globulin: 2.6 g/dL (ref 1.9–3.7)
Glucose, Bld: 68 mg/dL (ref 65–99)
Sodium: 139 mmol/L (ref 135–146)
Total Bilirubin: 1.7 mg/dL — ABNORMAL HIGH (ref 0.2–1.2)

## 2017-06-26 LAB — CK: Total CK: 6623 U/L — ABNORMAL HIGH (ref 44–196)

## 2017-06-26 LAB — CBC
HCT: 45 % (ref 38.5–50.0)
Hemoglobin: 15.8 g/dL (ref 13.2–17.1)
MCH: 31.6 pg (ref 27.0–33.0)
MCHC: 35.1 g/dL (ref 32.0–36.0)
MCV: 90 fL (ref 80.0–100.0)
MPV: 9.3 fL (ref 7.5–12.5)
Platelets: 299 10*3/uL (ref 140–400)
RBC: 5 Million/uL (ref 4.20–5.80)
RDW: 12.8 % (ref 11.0–15.0)
WBC: 6.7 10*3/uL (ref 3.8–10.8)

## 2017-06-27 DIAGNOSIS — M6282 Rhabdomyolysis: Secondary | ICD-10-CM | POA: Insufficient documentation

## 2017-06-27 NOTE — Assessment & Plan Note (Addendum)
Lab testing does confirm mild to moderate rhabdomyolysis with CK levels almost 7000 and LFTs in the 100s, he should avoid alcohol until have cleared him, and until liver function tests are back to normal, renal function is okay, hydration should be aggressive, would like to see him back and recheck CK levels and liver function tests in a week.

## 2017-07-04 ENCOUNTER — Ambulatory Visit (INDEPENDENT_AMBULATORY_CARE_PROVIDER_SITE_OTHER): Payer: 59 | Admitting: Sports Medicine

## 2017-07-04 ENCOUNTER — Encounter: Payer: Self-pay | Admitting: Sports Medicine

## 2017-07-04 DIAGNOSIS — Z0000 Encounter for general adult medical examination without abnormal findings: Secondary | ICD-10-CM | POA: Insufficient documentation

## 2017-07-04 DIAGNOSIS — R635 Abnormal weight gain: Secondary | ICD-10-CM | POA: Diagnosis not present

## 2017-07-04 DIAGNOSIS — T796XXD Traumatic ischemia of muscle, subsequent encounter: Secondary | ICD-10-CM

## 2017-07-04 DIAGNOSIS — Z113 Encounter for screening for infections with a predominantly sexual mode of transmission: Secondary | ICD-10-CM | POA: Insufficient documentation

## 2017-07-04 LAB — COMPREHENSIVE METABOLIC PANEL
AG Ratio: 1.7 (calc) (ref 1.0–2.5)
ALT: 56 U/L — ABNORMAL HIGH (ref 9–46)
AST: 30 U/L (ref 10–40)
Albumin: 4.8 g/dL (ref 3.6–5.1)
Alkaline phosphatase (APISO): 61 U/L (ref 40–115)
CO2: 32 mmol/L (ref 20–32)
Chloride: 98 mmol/L (ref 98–110)
Creat: 1.03 mg/dL (ref 0.60–1.35)
Globulin: 2.9 g/dL (calc) (ref 1.9–3.7)
Glucose, Bld: 76 mg/dL (ref 65–99)
Potassium: 4 mmol/L (ref 3.5–5.3)
Sodium: 137 mmol/L (ref 135–146)
Total Protein: 7.7 g/dL (ref 6.1–8.1)

## 2017-07-04 LAB — CK: Total CK: 142 U/L (ref 44–196)

## 2017-07-04 LAB — COMPREHENSIVE METABOLIC PANEL WITH GFR
BUN: 13 mg/dL (ref 7–25)
Calcium: 9.9 mg/dL (ref 8.6–10.3)
Total Bilirubin: 2.3 mg/dL — ABNORMAL HIGH (ref 0.2–1.2)

## 2017-07-04 MED ORDER — PHENTERMINE HCL 37.5 MG PO TABS: ORAL_TABLET | ORAL | 0 refills | Status: DC

## 2017-07-04 NOTE — Assessment & Plan Note (Signed)
Adding routine labs, he will come back fasting tomorrow to the lab. Also needs STD screening.

## 2017-07-04 NOTE — Assessment & Plan Note (Signed)
It has been a week, rechecking CK and CMP. Feeling much better.

## 2017-07-04 NOTE — Assessment & Plan Note (Signed)
I will give him another chance with phentermine. Return monthly for weight checks and refills.

## 2017-07-04 NOTE — Assessment & Plan Note (Signed)
Checking routine STD labs

## 2017-07-04 NOTE — Progress Notes (Signed)
  Subjective:    CC: Follow-up rhabdomyolysis  HPI: This is a pleasant 31 year old male, I saw him last week for widespread muscle aches and pains after significant physical exertion, we checked a CK levels which were significantly elevated, renal function was okay.  We started prednisone, hydration, and he was advised to avoid all but necessary physical activity.  He also had some significant back pain.  He returns today with symptoms almost completely resolved.  He would like some routine labs checked as well.  He would also like a full STD screen.  Past medical history:  Negative.  See flowsheet/record as well for more information.  Surgical history: Negative.  See flowsheet/record as well for more information.  Family history: Negative.  See flowsheet/record as well for more information.  Social history: Negative.  See flowsheet/record as well for more information.  Allergies, and medications have been entered into the medical record, reviewed, and no changes needed.   Review of Systems: No fevers, chills, night sweats, weight loss, chest pain, or shortness of breath.   Objective:    General: Well Developed, well nourished, and in no acute distress.  Neuro: Alert and oriented x3, extra-ocular muscles intact, sensation grossly intact.  HEENT: Normocephalic, atraumatic, pupils equal round reactive to light, neck supple, no masses, no lymphadenopathy, thyroid nonpalpable.  Skin: Warm and dry, no rashes. Cardiac: Regular rate and rhythm, no murmurs rubs or gallops, no lower extremity edema.  Respiratory: Clear to auscultation bilaterally. Not using accessory muscles, speaking in full sentences.  Labs drawn from the left cubital vein by Gena Frayharley Cummings, PA-C  Impression and Recommendations:    Rhabdomyolysis It has been a week, rechecking CK and CMP. Feeling much better.  Abnormal weight gain I will give him another chance with phentermine. Return monthly for weight checks and  refills.  Annual physical exam Adding routine labs, he will come back fasting tomorrow to the lab. Also needs STD screening.  Screening examination for STD (sexually transmitted disease) Checking routine STD labs  ___________________________________________ Ihor Austinhomas J. Benjamin Stainhekkekandam, M.D., ABFM., CAQSM. Primary Care and Sports Medicine [facility]  Adjunct Instructor of Family Medicine  [employer]

## 2017-07-21 LAB — C. TRACHOMATIS/N. GONORRHOEAE RNA
C. trachomatis RNA, TMA: NOT DETECTED
N. gonorrhoeae RNA, TMA: NOT DETECTED

## 2017-07-24 LAB — HEMOGLOBIN A1C
Hgb A1c MFr Bld: 4.9 % of total Hgb (ref <5.7)
Mean Plasma Glucose: 94 (calc)
eAG (mmol/L): 5.2 (calc)

## 2017-07-24 LAB — CBC
HCT: 43.9 % (ref 38.5–50.0)
Hemoglobin: 15.6 g/dL (ref 13.2–17.1)
MCH: 31.5 pg (ref 27.0–33.0)
MCHC: 35.5 g/dL (ref 32.0–36.0)
MCV: 88.5 fL (ref 80.0–100.0)
MPV: 9 fL (ref 7.5–12.5)
Platelets: 294 10*3/uL (ref 140–400)
RBC: 4.96 10*6/uL (ref 4.20–5.80)
RDW: 12.8 % (ref 11.0–15.0)
WBC: 6.3 10*3/uL (ref 3.8–10.8)

## 2017-07-24 LAB — TSH: TSH: 1.33 m[IU]/L (ref 0.40–4.50)

## 2017-07-24 LAB — LIPID PANEL W/REFLEX DIRECT LDL
Cholesterol: 193 mg/dL (ref <200)
HDL: 54 mg/dL (ref >40)
LDL Cholesterol (Calc): 119 mg/dL (calc) — ABNORMAL HIGH
Non-HDL Cholesterol (Calc): 139 mg/dL (calc) — ABNORMAL HIGH (ref <130)
Total CHOL/HDL Ratio: 3.6 (calc) (ref <5.0)
Triglycerides: 94 mg/dL (ref <150)

## 2017-07-24 LAB — HEPATITIS PANEL, ACUTE
Hep A IgM: NONREACTIVE
Hep B C IgM: NONREACTIVE
Hepatitis B Surface Ag: NONREACTIVE
Hepatitis C Ab: NONREACTIVE
SIGNAL TO CUT-OFF: 0.01 (ref <1.00)

## 2017-07-24 LAB — HSV 1/2 AB (IGM), IFA W/RFLX TITER
HSV 1 IgM Screen: NEGATIVE
HSV 2 IgM Screen: NEGATIVE

## 2017-07-24 LAB — HIV ANTIBODY (ROUTINE TESTING W REFLEX): HIV 1&2 Ab, 4th Generation: NONREACTIVE

## 2017-07-24 LAB — TESTOSTERONE, FREE & TOTAL
Free Testosterone: 88.3 pg/mL (ref 35.0–155.0)
Testosterone, Total, LC-MS-MS: 577 ng/dL (ref 250–1100)

## 2017-07-24 LAB — RPR: RPR Ser Ql: NONREACTIVE

## 2017-07-24 LAB — VITAMIN D 25 HYDROXY (VIT D DEFICIENCY, FRACTURES): Vit D, 25-Hydroxy: 35 ng/mL (ref 30–100)

## 2017-08-01 ENCOUNTER — Ambulatory Visit: Payer: 59 | Admitting: Sports Medicine

## 2017-08-06 ENCOUNTER — Other Ambulatory Visit: Payer: 59

## 2017-08-06 ENCOUNTER — Ambulatory Visit (INDEPENDENT_AMBULATORY_CARE_PROVIDER_SITE_OTHER): Payer: 59

## 2017-08-06 ENCOUNTER — Telehealth: Payer: Self-pay | Admitting: Sports Medicine

## 2017-08-06 DIAGNOSIS — M545 Low back pain, unspecified: Secondary | ICD-10-CM

## 2017-08-06 DIAGNOSIS — M5137 Other intervertebral disc degeneration, lumbosacral region: Secondary | ICD-10-CM

## 2017-08-06 NOTE — Telephone Encounter (Signed)
Back pain in spite of physical therapy, medications for greater than 6 weeks, proceeding with MRI.

## 2017-08-08 ENCOUNTER — Ambulatory Visit: Payer: 59 | Admitting: Sports Medicine

## 2017-08-08 DIAGNOSIS — Z0189 Encounter for other specified special examinations: Secondary | ICD-10-CM

## 2017-08-30 ENCOUNTER — Ambulatory Visit: Payer: 59 | Admitting: Sports Medicine

## 2017-08-30 ENCOUNTER — Encounter: Payer: Self-pay | Admitting: Sports Medicine

## 2017-08-30 DIAGNOSIS — Z0189 Encounter for other specified special examinations: Secondary | ICD-10-CM

## 2017-10-18 DIAGNOSIS — H5213 Myopia, bilateral: Secondary | ICD-10-CM | POA: Diagnosis not present

## 2018-04-19 ENCOUNTER — Telehealth: Payer: Self-pay

## 2018-04-19 NOTE — Telephone Encounter (Signed)
Pt was dismissed 08-30-17 for 4 no-shows.   Pt calls today and asks to be re-accepted and also is requesting that Dr T be his PCP.. I advised pt he has been dismissed due to no-shows and he asks that Dr T considers giving him another chance and also accepts him as a full time pt..  Please advise

## 2018-05-02 IMAGING — MR MR [PERSON_NAME]*[PERSON_NAME]* W/O CM
5 series · 38 of 40 positions shown · non-contrast
Comparison: None.

CLINICAL DATA: Pain on the radial aspect at the PIP persistent
despite conservative measures, history of development of
boutonniere's deformity on the other side with similar trauma.
Injury playing soccer.

EXAM:
MRI OF THE RIGHT FINGERS WITHOUT CONTRAST
TECHNIQUE: Multiplanar, multisequence MR imaging of the right fourth digit was
performed. No intravenous contrast was administered.

[Series 4: T1 · axial · 4.0mm · 0.44mm/px · z∈[-72,+78]mm · 8 of 31 slices shown]
[im 1/31]
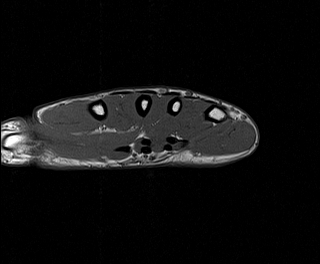
[im 4/31]
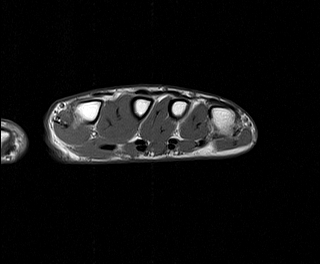
[im 11/31]
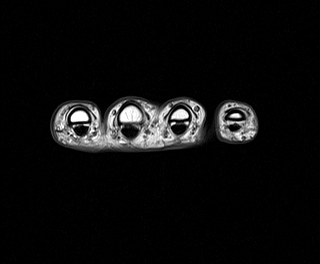
[im 14/31]
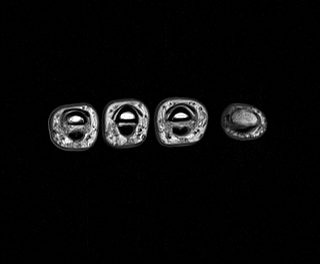
[im 17/31]
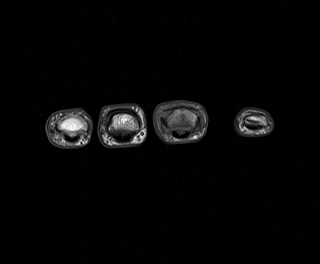
[im 21/31]
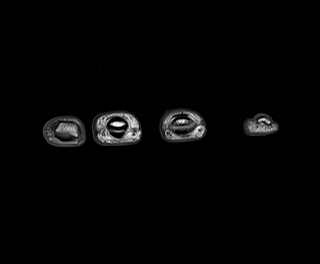
[im 27/31]
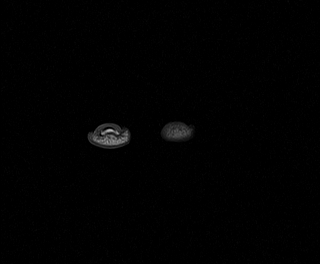
[im 31/31]
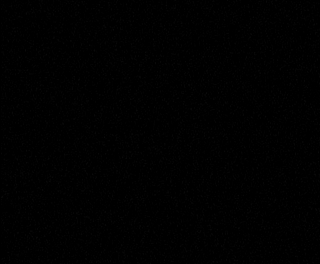

[Series 5: PD fat-sat · coronal · 2.0mm · 0.25mm/px · 7 of 20 slices shown]
[im 1/20]
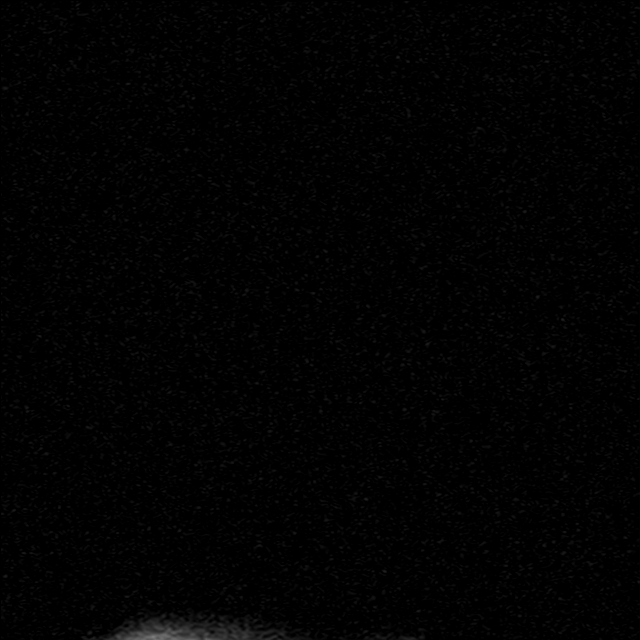
[im 4/20]
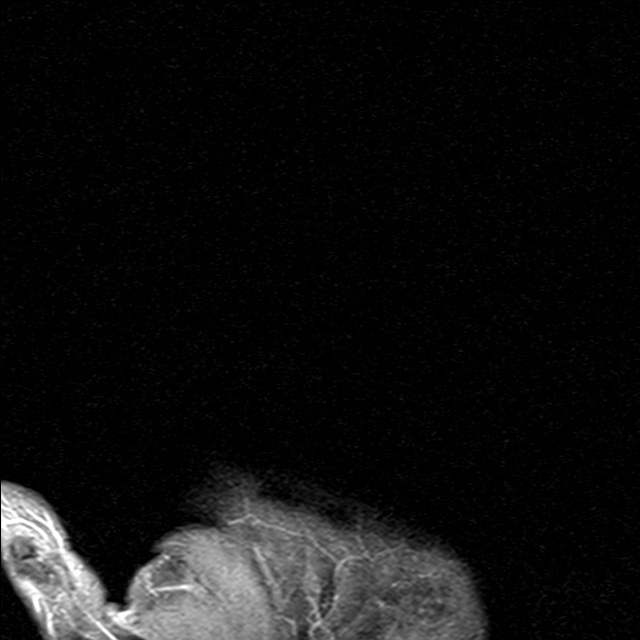
[im 7/20]
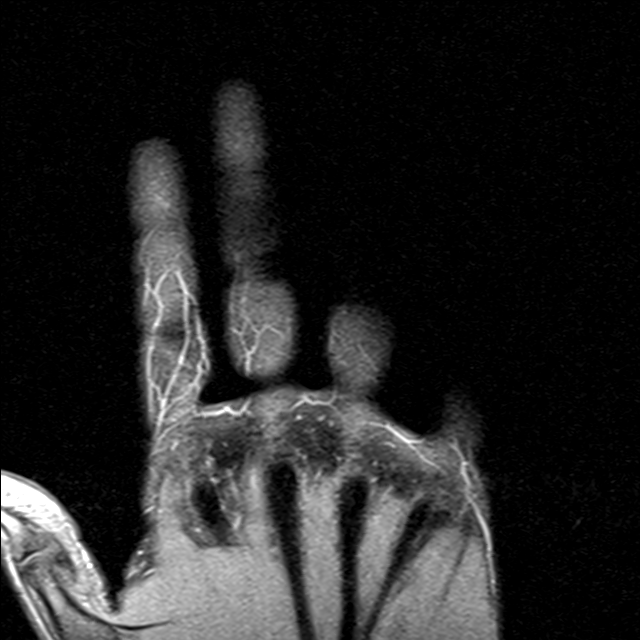
[im 10/20]
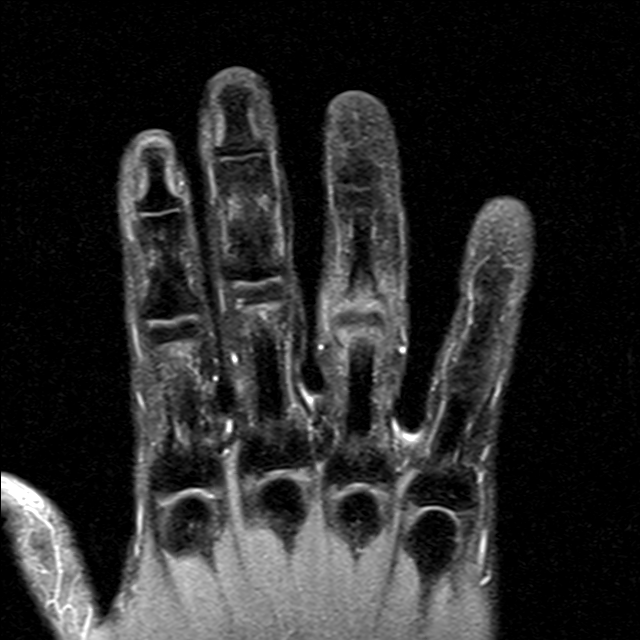
[im 13/20]
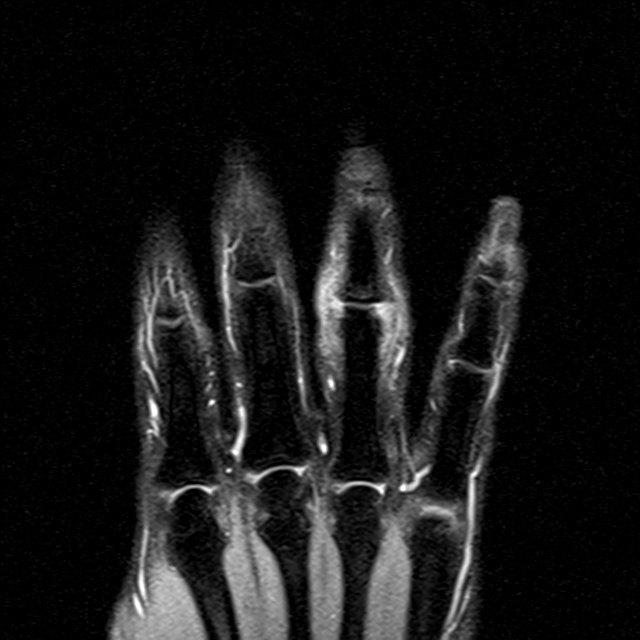
[im 16/20]
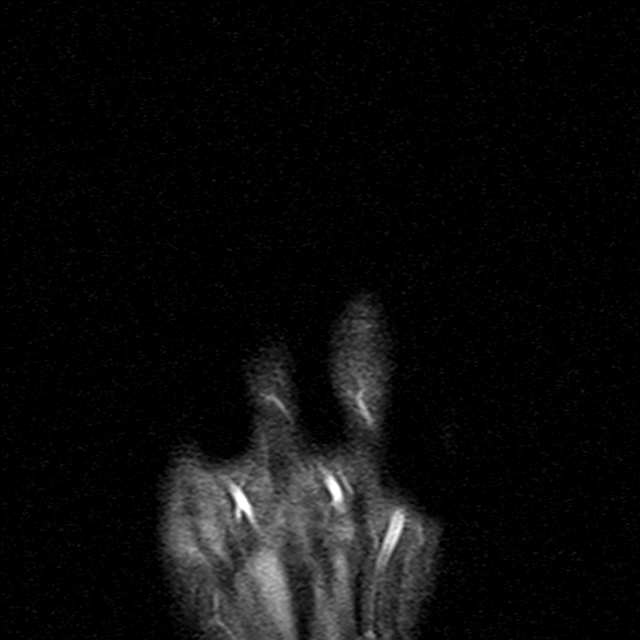
[im 20/20]
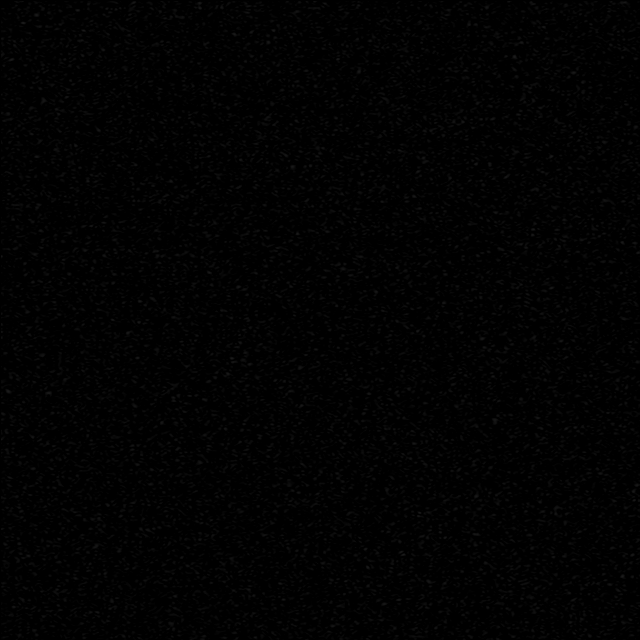

[Series 6: T2 fat-sat · coronal · 2.0mm · 0.62mm/px · 7 of 20 slices shown (1 of 2)]
[im 1/20]
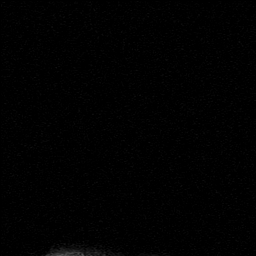
[im 4/20]
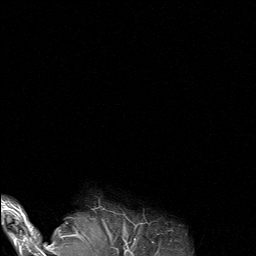
[im 7/20]
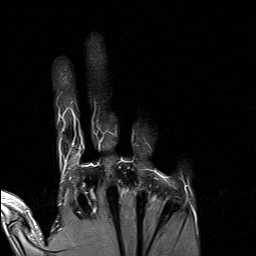
[im 10/20]
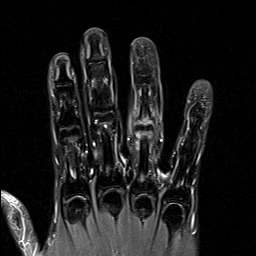
[im 13/20]
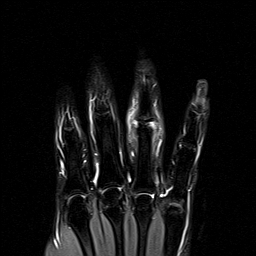
[im 16/20]
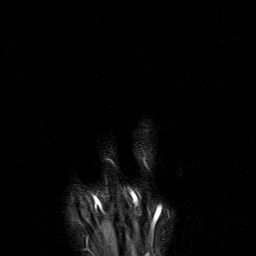
[im 20/20]
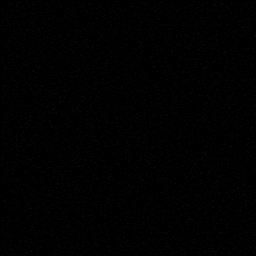

[Series 7: STIR · sagittal · 2.0mm · 0.50mm/px · 6 of 18 slices shown]
[im 1/18]
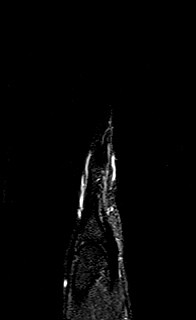
[im 4/18]
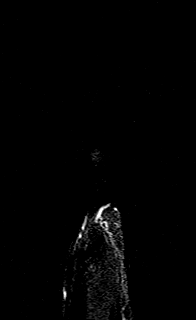
[im 7/18]
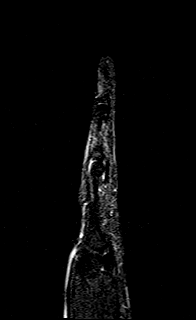
[im 11/18]
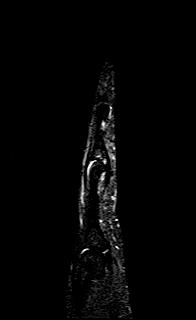
[im 14/18]
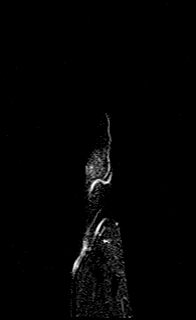
[im 18/18]
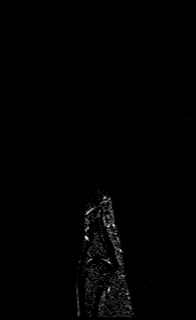

[Series 8: T2 fat-sat · axial · 4.0mm · 0.55mm/px · z∈[-72,+78]mm · 10 of 31 slices shown (2 of 2)]
[im 1/31]
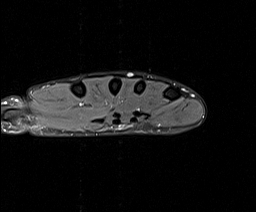
[im 4/31]
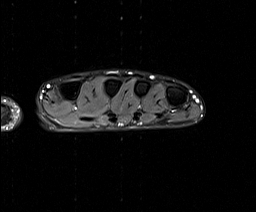
[im 7/31]
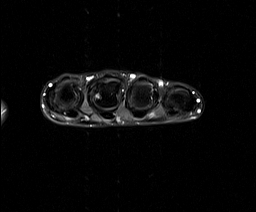
[im 11/31]
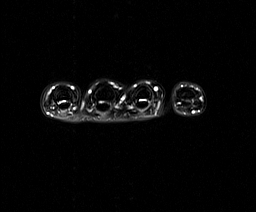
[im 14/31]
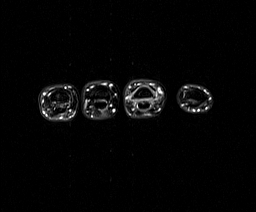
[im 17/31]
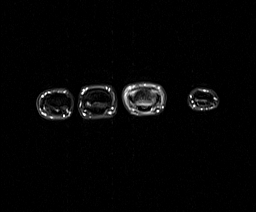
[im 21/31]
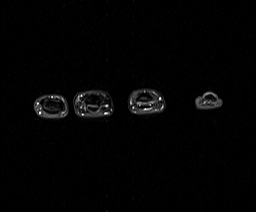
[im 24/31]
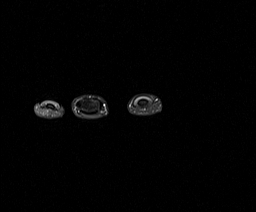
[im 27/31]
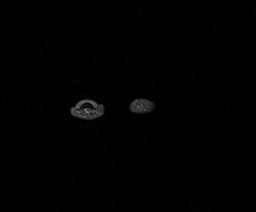
[im 31/31]
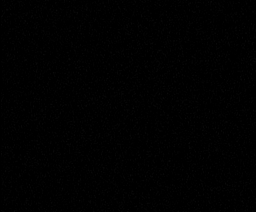

[38 of 40 positions shown; findings below may reference images not displayed]

FINDINGS: Bones/Joint/Cartilage

Subchondral marrow edema in the base of the fourth middle phalanx.
Mild soft tissue edema surrounding the collateral ligaments of the
fourth proximal phalanx, particularly along the radial aspect.

No definite fracture or dislocation. Normal alignment. No joint
effusion.

Ligaments

Collateral ligaments are otherwise intact.

Muscles and Tendons
Flexor and extensor compartment tendons are intact.

Soft tissue
No fluid collection or hematoma.  No soft tissue mass.
IMPRESSION: Subchondral marrow edema in the base of the fourth middle phalanx.
Mild soft tissue edema surrounding the collateral ligaments of the
fourth proximal phalanx, particularly along the radial aspect
consistent with injury. No definite acute fracture.

## 2018-05-22 NOTE — Telephone Encounter (Signed)
Can you please advise?

## 2018-05-22 NOTE — Telephone Encounter (Signed)
Left msg with pt advising of this

## 2018-05-22 NOTE — Telephone Encounter (Signed)
Per practice policy I cannot however I would recommend him establish with Gena Fray, PA-C and I supervise her care as she is my PA.

## 2018-10-23 DIAGNOSIS — J0100 Acute maxillary sinusitis, unspecified: Secondary | ICD-10-CM | POA: Diagnosis not present

## 2018-10-23 DIAGNOSIS — J309 Allergic rhinitis, unspecified: Secondary | ICD-10-CM | POA: Diagnosis not present

## 2018-10-23 DIAGNOSIS — Z5181 Encounter for therapeutic drug level monitoring: Secondary | ICD-10-CM | POA: Diagnosis not present

## 2018-10-23 DIAGNOSIS — E782 Mixed hyperlipidemia: Secondary | ICD-10-CM | POA: Diagnosis not present

## 2019-09-03 ENCOUNTER — Ambulatory Visit (INDEPENDENT_AMBULATORY_CARE_PROVIDER_SITE_OTHER): Payer: 59 | Admitting: Sports Medicine

## 2019-09-03 ENCOUNTER — Other Ambulatory Visit: Payer: Self-pay

## 2019-09-03 ENCOUNTER — Other Ambulatory Visit: Payer: Self-pay | Admitting: Sports Medicine

## 2019-09-03 DIAGNOSIS — M795 Residual foreign body in soft tissue: Secondary | ICD-10-CM | POA: Diagnosis not present

## 2019-09-03 DIAGNOSIS — J3089 Other allergic rhinitis: Secondary | ICD-10-CM

## 2019-09-03 MED ORDER — AZELASTINE-FLUTICASONE 137-50 MCG/ACT NA SUSP: NASAL | 11 refills | Status: AC

## 2019-09-03 MED ORDER — LEVOCETIRIZINE DIHYDROCHLORIDE 5 MG PO TABS: 5.0000 mg | ORAL_TABLET | Freq: Every evening | ORAL | 3 refills | Status: AC

## 2019-09-03 NOTE — Assessment & Plan Note (Signed)
This pleasant 34 year old male has had a runny nose for 2 years on and off somewhat constantly, not affected by the seasons. He has tried Singulair, Claritin without much improvement, he has tried a few nasal steroid inhalers. Switching to Xyzal and Dymista. We can revisit this in a few weeks.

## 2019-09-03 NOTE — Progress Notes (Signed)
    Procedures performed today:    Procedure:  Excision of foreign body right upper abdomen Risks, benefits, and alternatives explained and consent obtained. Time out conducted. Surface prepped with alcohol. 1cc lidocaine with epinephine infiltrated in a field block. Adequate anesthesia ensured. Area prepped and draped in a sterile fashion. Excision performed with: Using a 6 mm punch I removed the lesion as a whole through to the subcutaneous tissues, I then closed the incision with a 3-0 Ethilon horizontal mattress. Hemostasis achieved. Pt stable. This procedure will not be billed for.  Independent interpretation of tests performed by another provider:   None.  Impression and Recommendations:    Foreign body (FB) in soft tissue This young man has had what appears to be a foreign body under the skin in his right upper abdominal soft tissues for some time now. Unable to remove with forceps so we simply did a full punch biopsy, sent for dermatopathology.. Closed with a single 3-0 Ethilon horizontal mattress. His girlfriend is a spine surgery PA and she can remove the suture and in approximately 7 to 10 days.  Perennial allergic rhinitis This pleasant 34 year old male has had a runny nose for 2 years on and off somewhat constantly, not affected by the seasons. He has tried Singulair, Claritin without much improvement, he has tried a few nasal steroid inhalers. Switching to Xyzal and Dymista. We can revisit this in a few weeks.    ___________________________________________ Craig Stewart. Benjamin Stain, M.D., ABFM., CAQSM. Primary Care and Sports Medicine [place] [facility]  Adjunct Instructor of Family Medicine  [employer]

## 2019-09-03 NOTE — Addendum Note (Signed)
Addended by: Juel Burrow on: 09/03/2019 03:53 PM   Modules accepted: Orders

## 2019-09-03 NOTE — Assessment & Plan Note (Addendum)
This young man has had what appears to be a foreign body under the skin in his right upper abdominal soft tissues for some time now. Unable to remove with forceps so we simply did a full punch biopsy, sent for dermatopathology.. Closed with a single 3-0 Ethilon horizontal mattress. His girlfriend is a spine surgery PA and she can remove the suture and in approximately 7 to 10 days.

## 2019-11-27 ENCOUNTER — Ambulatory Visit: Payer: 59 | Attending: Internal Medicine

## 2019-11-27 DIAGNOSIS — Z23 Encounter for immunization: Secondary | ICD-10-CM

## 2019-11-27 NOTE — Progress Notes (Signed)
   Covid-19 Vaccination Clinic  Name:  Craig Stewart    MRN: [id number] DOB: 06-13-1986  11/27/2019  [name] was observed post Covid-19 immunization for 15 minutes without incident. He was provided with Vaccine Information Sheet and instruction to access the V-Safe system.   [name] was instructed to call 911 with any severe reactions post vaccine: Marland Kitchen Difficulty breathing  . Swelling of face and throat  . A fast heartbeat  . A bad rash all over body  . Dizziness and weakness   Immunizations Administered    Name Date Dose VIS Date Route   Pfizer COVID-19 Vaccine 11/27/2019  9:12 AM 0.3 mL 08/01/2019 Intramuscular   Manufacturer: [employer], [employer]   Lot: [id number]   NDC: [address]-9

## 2019-12-23 ENCOUNTER — Ambulatory Visit: Payer: 59 | Attending: Internal Medicine

## 2019-12-23 DIAGNOSIS — Z23 Encounter for immunization: Secondary | ICD-10-CM

## 2019-12-23 NOTE — Progress Notes (Signed)
   Covid-19 Vaccination Clinic  Name:  Loren Sawaya    MRN: [id number] DOB: Sep 28, 1985  12/23/2019  Mr. Wetmore was observed post Covid-19 immunization for 8 minutes without incident. Patient decided he needed to leave early and did not wish to stay the full 15 minutes. He was provided with Vaccine Information Sheet and instruction to access the V-Safe system.   Mr. Holtzer was instructed to call 911 with any severe reactions post vaccine: Marland Kitchen Difficulty breathing  . Swelling of face and throat  . A fast heartbeat  . A bad rash all over body  . Dizziness and weakness   Immunizations Administered    Name Date Dose VIS Date Route   Pfizer COVID-19 Vaccine 12/23/2019  5:06 PM 0.3 mL 10/15/2018 Intramuscular   Manufacturer: [employer], [employer]   Lot: [id number]   NDC: [address]-5

## 2020-01-20 ENCOUNTER — Ambulatory Visit: Payer: 59 | Admitting: Sports Medicine

## 2020-01-20 ENCOUNTER — Other Ambulatory Visit: Payer: Self-pay

## 2020-01-20 ENCOUNTER — Ambulatory Visit (INDEPENDENT_AMBULATORY_CARE_PROVIDER_SITE_OTHER): Payer: 59 | Admitting: Sports Medicine

## 2020-01-20 ENCOUNTER — Encounter: Payer: Self-pay | Admitting: Sports Medicine

## 2020-01-20 DIAGNOSIS — R635 Abnormal weight gain: Secondary | ICD-10-CM | POA: Diagnosis not present

## 2020-01-20 MED ORDER — PHENTERMINE HCL 37.5 MG PO TABS: ORAL_TABLET | ORAL | 0 refills | Status: DC

## 2020-01-20 NOTE — Progress Notes (Signed)
    Procedures performed today:    None.  Independent interpretation of notes and tests performed by another provider:   None.  Brief History, Exam, Impression, and Recommendations:    Abnormal weight gain Increasing weight through COVID-19 pandemic, weight is not at goal, desires to lose, adding phentermine.    ___________________________________________ Ihor Austin. Benjamin Stain, M.D., ABFM., CAQSM. Primary Care and Sports Medicine [facility]  Adjunct Instructor of Family Medicine  [employer]

## 2020-01-20 NOTE — Assessment & Plan Note (Signed)
Increasing weight through COVID-19 pandemic, weight is not at goal, desires to lose, adding phentermine.

## 2020-04-13 ENCOUNTER — Telehealth: Payer: Self-pay | Admitting: Sports Medicine

## 2020-04-13 DIAGNOSIS — T753XXA Motion sickness, initial encounter: Secondary | ICD-10-CM | POA: Insufficient documentation

## 2020-04-13 MED ORDER — SCOPOLAMINE 1 MG/3DAYS TD PT72: 1.0000 | MEDICATED_PATCH | TRANSDERMAL | 3 refills | Status: DC

## 2020-04-13 MED ORDER — ONDANSETRON 8 MG PO TBDP: 8.0000 mg | ORAL_TABLET | Freq: Three times a day (TID) | ORAL | 3 refills | Status: AC | PRN

## 2020-04-13 NOTE — Telephone Encounter (Signed)
This 34 year old male who is going overseas and he will be doing some sailing, per his request I will add scopolamine patches and Zofran.

## 2023-05-24 ENCOUNTER — Telehealth: Payer: Self-pay | Admitting: Sports Medicine

## 2023-05-24 DIAGNOSIS — Z3169 Encounter for other general counseling and advice on procreation: Secondary | ICD-10-CM | POA: Insufficient documentation

## 2023-05-24 NOTE — Telephone Encounter (Signed)
Infertility counseling Craig Stewart and his wife have been trying to have a child for a year now, they have been unable, he does not have any other children, I will going order semen analysis with [facility].

## 2023-05-24 NOTE — Assessment & Plan Note (Signed)
Craig Stewart and his wife have been trying to have a child for a year now, they have been unable, he does not have any other children, I will going order semen analysis with [facility].

## 2023-07-05 ENCOUNTER — Encounter: Payer: Self-pay | Admitting: Sports Medicine

## 2023-10-01 ENCOUNTER — Ambulatory Visit (INDEPENDENT_AMBULATORY_CARE_PROVIDER_SITE_OTHER): Payer: 59 | Admitting: Sports Medicine

## 2023-10-01 ENCOUNTER — Encounter: Payer: Self-pay | Admitting: Sports Medicine

## 2023-10-01 DIAGNOSIS — E782 Mixed hyperlipidemia: Secondary | ICD-10-CM

## 2023-10-01 DIAGNOSIS — E7800 Pure hypercholesterolemia, unspecified: Secondary | ICD-10-CM | POA: Diagnosis not present

## 2023-10-01 DIAGNOSIS — L918 Other hypertrophic disorders of the skin: Secondary | ICD-10-CM

## 2023-10-01 DIAGNOSIS — E559 Vitamin D deficiency, unspecified: Secondary | ICD-10-CM | POA: Diagnosis not present

## 2023-10-01 DIAGNOSIS — E291 Testicular hypofunction: Secondary | ICD-10-CM | POA: Diagnosis not present

## 2023-10-01 DIAGNOSIS — R790 Abnormal level of blood mineral: Secondary | ICD-10-CM | POA: Diagnosis not present

## 2023-10-01 NOTE — Progress Notes (Signed)
    Procedures performed today:    Procedure:  Cryodestruction of 2 left and 1 right anterior hip skin tags Consent obtained and verified. Time-out conducted. Noted no overlying erythema, induration, or other signs of local infection. Completed without difficulty using Cryo-Gun. Advised to call if fevers/chills, erythema, induration, drainage, or persistent bleeding.  Independent interpretation of notes and tests performed by another provider:   None.  Brief History, Exam, Impression, and Recommendations:    Acrochordon Couple of skin tags noted, patient prefers cryotherapy, he does have a cruise coming up in about a month. We did cryotherapy on 2 skin tags left anterior hip and 1 on the right. If they are still present or not improving by a couple of weeks I am happy to do a surgical excision with hyfrecation.  Mixed hyperlipidemia Elevated lipids, rechecking today, patient would like some of the other more advanced lipid particle testing done, he understands that we will base our treatment off of his LDL, HDL and total cholesterol, I do suspect he will need Crestor.    ____________________________________________ Joselyn Nicely. Sandy Crumb, M.D., ABFM., CAQSM., AME. Primary Care and Sports Medicine  [facility]  Adjunct Professor of [facility] [employer] [facility] Medicine  [employer]  Restaurant manager, fast food

## 2023-10-01 NOTE — Assessment & Plan Note (Signed)
 Elevated lipids, rechecking today, patient would like some of the other more advanced lipid particle testing done, he understands that we will base our treatment off of his LDL, HDL and total cholesterol, I do suspect he will need Crestor.

## 2023-10-01 NOTE — Assessment & Plan Note (Signed)
 Couple of skin tags noted, patient prefers cryotherapy, he does have a cruise coming up in about a month. We did cryotherapy on 2 skin tags left anterior hip and 1 on the right. If they are still present or not improving by a couple of weeks I am happy to do a surgical excision with hyfrecation.

## 2023-10-15 ENCOUNTER — Ambulatory Visit (INDEPENDENT_AMBULATORY_CARE_PROVIDER_SITE_OTHER): Payer: 59 | Admitting: Sports Medicine

## 2023-10-15 DIAGNOSIS — E782 Mixed hyperlipidemia: Secondary | ICD-10-CM | POA: Diagnosis not present

## 2023-10-15 DIAGNOSIS — L918 Other hypertrophic disorders of the skin: Secondary | ICD-10-CM

## 2023-10-15 DIAGNOSIS — Z3169 Encounter for other general counseling and advice on procreation: Secondary | ICD-10-CM

## 2023-10-15 NOTE — Progress Notes (Signed)
    Procedures performed today:    None.  Independent interpretation of notes and tests performed by another provider:   None.  Brief History, Exam, Impression, and Recommendations:    Acrochordon Skin tags have responded well to cryotherapy on #3 skin tags performed at the last visit, return as needed.  Infertility counseling Craig Stewart and his wife Craig Stewart are still trying to get pregnant, they have been trying now for over 6 months, almost a year, semen analysis was normal, workup with [facility] for his wife has also been normal with what sounds to be normal ultrasound and hysterosalpingogram. She is doing hormone injections to stimulate ovulation. It sounds like they have an IUI coming up within the next couple of months. I have wished Craig Stewart the best and I am here to help him navigate the system.  Mixed hyperlipidemia Has not yet gotten his advanced lipid testing done, I do suspect he will need Crestor.    ____________________________________________ Ihor Austin. Benjamin Stain, M.D., ABFM., CAQSM., AME. Primary Care and Sports Medicine [place] [facility]  Adjunct Professor of Family Medicine  [employer]  Restaurant manager, fast food

## 2023-10-15 NOTE — Assessment & Plan Note (Signed)
 Has not yet gotten his advanced lipid testing done, I do suspect he will need Crestor.

## 2023-10-15 NOTE — Assessment & Plan Note (Addendum)
 Skin tags have responded well to cryotherapy on #3 skin tags performed at the last visit, return as needed.

## 2023-10-15 NOTE — Assessment & Plan Note (Addendum)
 Craig Stewart and his wife Craig Stewart are still trying to get pregnant, they have been trying now for over 6 months, almost a year, semen analysis was normal, workup with [facility] for his wife has also been normal with what sounds to be normal ultrasound and hysterosalpingogram. She is doing hormone injections to stimulate ovulation. It sounds like they have an IUI coming up within the next couple of months. I have wished Craig Stewart the best and I am here to help him navigate the system.

## 2023-12-03 ENCOUNTER — Encounter: Payer: Self-pay | Admitting: Sports Medicine

## 2023-12-03 MED ORDER — VALACYCLOVIR HCL 1 G PO TABS: 1000.0000 mg | ORAL_TABLET | Freq: Two times a day (BID) | ORAL | 2 refills | Status: AC

## 2024-04-18 ENCOUNTER — Ambulatory Visit: Payer: Self-pay | Admitting: Family Medicine

## 2024-04-18 NOTE — Progress Notes (Signed)
 Hi Craig Stewart, I am covering for Dr. ONEIDA Your kidney function went up slightly it looks like your baseline previously was 1.0 this time it was 1.2.  Doing a keep an eye on that and plan to recheck in 6 months.  Liver enzymes look good looks like you always have a slightly elevated bilirubin so that is pretty stable.  LDL cholesterol is up a little just encouraged to continue to work on healthy Mediterranean diet and regular exercise shooting for 30 minutes 5 days a week.  Total testosterone  looks good but free testosterone  is on the low end.  He is order a cardiac enzyme called apolipoprotein B.  It is just a little borderline elevated indicating some slight increased risk for cardiovascular disease.  Your magnesium was normal.  Blood count was normal.  A1c looks great no sign of diabetes or prediabetes.  Thyroid function is normal.  Vitamin D  looks fantastic.  LP(a) looks good.  APO genotyping is still pending.

## 2024-04-22 ENCOUNTER — Encounter: Payer: Self-pay | Admitting: Sports Medicine

## 2024-04-29 LAB — COMPREHENSIVE METABOLIC PANEL WITH GFR
ALT: 42 IU/L (ref 0–44)
AST: 39 IU/L (ref 0–40)
Albumin: 5 g/dL (ref 4.1–5.1)
Alkaline Phosphatase: 90 IU/L (ref 44–121)
BUN/Creatinine Ratio: 16 (ref 9–20)
BUN: 20 mg/dL (ref 6–24)
Bilirubin Total: 2.6 mg/dL — ABNORMAL HIGH (ref 0.0–1.2)
CO2: 22 mmol/L (ref 20–29)
Calcium: 9.9 mg/dL (ref 8.7–10.2)
Chloride: 99 mmol/L (ref 96–106)
Creatinine, Ser: 1.29 mg/dL — ABNORMAL HIGH (ref 0.76–1.27)
Globulin, Total: 2.7 g/dL (ref 1.5–4.5)
Glucose: 78 mg/dL (ref 70–99)
Potassium: 4.2 mmol/L (ref 3.5–5.2)
Sodium: 138 mmol/L (ref 134–144)
Total Protein: 7.7 g/dL (ref 6.0–8.5)
eGFR: 72 mL/min/1.73 (ref >59)

## 2024-04-29 LAB — TESTOSTERONE, FREE, TOTAL, SHBG
Sex Hormone Binding: 38.5 nmol/L (ref 16.5–55.9)
Testosterone, Free: 5.5 pg/mL — ABNORMAL LOW (ref 6.8–21.5)
Testosterone: 406 ng/dL (ref 264–916)

## 2024-04-29 LAB — CBC
Hematocrit: 46.4 % (ref 37.5–51.0)
Hemoglobin: 16.3 g/dL (ref 13.0–17.7)
MCH: 32.6 pg (ref 26.6–33.0)
MCHC: 35.1 g/dL (ref 31.5–35.7)
MCV: 93 fL (ref 79–97)
Platelets: 269 x10E3/uL (ref 150–450)
RBC: 5 x10E6/uL (ref 4.14–5.80)
RDW: 13.2 % (ref 11.6–15.4)
WBC: 4.2 x10E3/uL (ref 3.4–10.8)

## 2024-04-29 LAB — APO E GENOTYPING: CARDIO RISK

## 2024-04-29 LAB — LIPID PANEL
Chol/HDL Ratio: 3.3 ratio (ref 0.0–5.0)
Cholesterol, Total: 183 mg/dL (ref 100–199)
HDL: 55 mg/dL (ref >39)
LDL Chol Calc (NIH): 113 mg/dL — ABNORMAL HIGH (ref 0–99)
Triglycerides: 81 mg/dL (ref 0–149)
VLDL Cholesterol Cal: 15 mg/dL (ref 5–40)

## 2024-04-29 LAB — HEMOGLOBIN A1C
Est. average glucose Bld gHb Est-mCnc: 97 mg/dL
Hgb A1c MFr Bld: 5 % (ref 4.8–5.6)

## 2024-04-29 LAB — LIPOPROTEIN A (LPA): Lipoprotein (a): 34.8 nmol/L (ref <75.0)

## 2024-04-29 LAB — APOLIPOPROTEIN B: Apolipoprotein B: 94 mg/dL — ABNORMAL HIGH (ref <90)

## 2024-04-29 LAB — VITAMIN D 25 HYDROXY (VIT D DEFICIENCY, FRACTURES): Vit D, 25-Hydroxy: 79.4 ng/mL (ref 30.0–100.0)

## 2024-04-29 LAB — TSH: TSH: 1.7 u[IU]/mL (ref 0.450–4.500)

## 2024-04-29 LAB — MAGNESIUM: Magnesium: 2.4 mg/dL — ABNORMAL HIGH (ref 1.6–2.3)

## 2024-04-29 LAB — APOLIPOPROTEIN A-1: Apolipoprotein A-1: 153 mg/dL (ref 101–178)

## 2024-05-02 ENCOUNTER — Encounter: Payer: Self-pay | Admitting: Family Medicine
